# Patient Record
Sex: Female | Born: 1940 | Race: White | Hispanic: No | Marital: Married | Smoking: Never smoker
Health system: Southern US, Community
[De-identification: ages and names within clinical notes are randomized; demographics above are authoritative.]

## PROBLEM LIST (undated history)

## (undated) DIAGNOSIS — R0600 Dyspnea, unspecified: Secondary | ICD-10-CM

## (undated) DIAGNOSIS — K219 Gastro-esophageal reflux disease without esophagitis: Secondary | ICD-10-CM

## (undated) DIAGNOSIS — M1990 Unspecified osteoarthritis, unspecified site: Secondary | ICD-10-CM

## (undated) DIAGNOSIS — Z923 Personal history of irradiation: Secondary | ICD-10-CM

## (undated) DIAGNOSIS — N189 Chronic kidney disease, unspecified: Secondary | ICD-10-CM

## (undated) DIAGNOSIS — C801 Malignant (primary) neoplasm, unspecified: Secondary | ICD-10-CM

## (undated) DIAGNOSIS — C50919 Malignant neoplasm of unspecified site of unspecified female breast: Secondary | ICD-10-CM

## (undated) DIAGNOSIS — D649 Anemia, unspecified: Secondary | ICD-10-CM

## (undated) DIAGNOSIS — J45909 Unspecified asthma, uncomplicated: Secondary | ICD-10-CM

## (undated) DIAGNOSIS — D869 Sarcoidosis, unspecified: Secondary | ICD-10-CM

## (undated) DIAGNOSIS — I10 Essential (primary) hypertension: Secondary | ICD-10-CM

## (undated) DIAGNOSIS — E039 Hypothyroidism, unspecified: Secondary | ICD-10-CM

## (undated) HISTORY — PX: ABDOMINAL HYSTERECTOMY: SHX81

## (undated) HISTORY — PX: HAMMER TOE SURGERY: SHX385

## (undated) HISTORY — PX: TUMOR REMOVAL: SHX12

## (undated) HISTORY — PX: BREAST LUMPECTOMY: SHX2

## (undated) HISTORY — PX: BREAST SURGERY: SHX581

## (undated) HISTORY — PX: EYE SURGERY: SHX253

## (undated) HISTORY — PX: JOINT REPLACEMENT: SHX530

---
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
## 2007-09-06 DIAGNOSIS — Z923 Personal history of irradiation: Secondary | ICD-10-CM

## 2007-09-06 DIAGNOSIS — C50919 Malignant neoplasm of unspecified site of unspecified female breast: Secondary | ICD-10-CM

## 2007-09-06 HISTORY — DX: Personal history of irradiation: Z92.3

## 2007-09-06 HISTORY — DX: Malignant neoplasm of unspecified site of unspecified female breast: C50.919

## 2010-11-04 DEATH — deceased

## 2016-09-05 DIAGNOSIS — H0510 Unspecified chronic inflammatory disorders of orbit: Secondary | ICD-10-CM

## 2016-09-05 HISTORY — DX: Unspecified chronic inflammatory disorders of orbit: H05.10

## 2019-08-21 ENCOUNTER — Other Ambulatory Visit: Payer: Self-pay | Admitting: Orthopedic Surgery

## 2019-08-22 ENCOUNTER — Encounter
Admission: RE | Admit: 2019-08-22 | Discharge: 2019-08-22 | Disposition: A | Discharge status: Home / Self Care | Payer: Medicare Other | Source: Ambulatory Visit | Attending: Orthopedic Surgery | Admitting: Orthopedic Surgery

## 2019-08-22 ENCOUNTER — Other Ambulatory Visit: Payer: Self-pay

## 2019-08-22 DIAGNOSIS — Z01818 Encounter for other preprocedural examination: Secondary | ICD-10-CM | POA: Insufficient documentation

## 2019-08-22 DIAGNOSIS — Z20828 Contact with and (suspected) exposure to other viral communicable diseases: Secondary | ICD-10-CM | POA: Diagnosis not present

## 2019-08-22 DIAGNOSIS — I10 Essential (primary) hypertension: Secondary | ICD-10-CM | POA: Diagnosis not present

## 2019-08-22 HISTORY — DX: Gastro-esophageal reflux disease without esophagitis: K21.9

## 2019-08-22 HISTORY — DX: Dyspnea, unspecified: R06.00

## 2019-08-22 HISTORY — DX: Essential (primary) hypertension: I10

## 2019-08-22 HISTORY — DX: Unspecified asthma, uncomplicated: J45.909

## 2019-08-22 HISTORY — DX: Sarcoidosis, unspecified: D86.9

## 2019-08-22 HISTORY — DX: Hypothyroidism, unspecified: E03.9

## 2019-08-22 HISTORY — DX: Malignant (primary) neoplasm, unspecified: C80.1

## 2019-08-22 HISTORY — DX: Chronic kidney disease, unspecified: N18.9

## 2019-08-22 HISTORY — DX: Unspecified osteoarthritis, unspecified site: M19.90

## 2019-08-22 HISTORY — DX: Anemia, unspecified: D64.9

## 2019-08-22 NOTE — Patient Instructions (Addendum)
Your procedure is scheduled on: 08-27-19 TUESDAY Report to Same Day Surgery 2nd floor medical mall [facility] Entrance-take elevator on left to 2nd floor.  Check in with surgery information desk.) To find out your arrival time please call [phone] between 1PM - 3PM on 08-26-19 MONDAY  Remember: Instructions that are not followed completely may result in serious medical risk, up to and including death, or upon the discretion of your surgeon and anesthesiologist your surgery may need to be rescheduled.    _x___ 1. Do not eat food after midnight the night before your procedure. NO GUM OR CANDY AFTER MIDNIGHT. You may drink clear liquids up to 2 hours before you are scheduled to arrive at the hospital for your procedure.  Do not drink clear liquids within 2 hours of your scheduled arrival to the hospital.  Clear liquids include  --Water or Apple juice without pulp  --Gatorade  --Black Coffee or Clear Tea (No milk, no creamers, do not add anything to the coffee or Tea   ____Ensure clear carbohydrate drink on the way to the hospital for bariatric patients  _X___Ensure clear carbohydrate drink 3 hours  PRIOR TO ARRIVAL TIME (IF YOUR ARRIVAL TIME IS 6 AM, HAVE ENSURE FINISHED BY 4:30 AM.)     __x__ 2. No Alcohol for 24 hours before or after surgery.   __x__3. No Smoking or e-cigarettes for 24 prior to surgery.  Do not use any chewable tobacco products for at least 6 hour prior to surgery   ____  4. Bring all medications with you on the day of surgery if instructed.    __x__ 5. Notify your doctor if there is any change in your medical condition     (cold, fever, infections).    x___6. On the morning of surgery brush your teeth with toothpaste and water.  You may rinse your mouth with mouth wash if you wish.  Do not swallow any toothpaste or mouthwash.   Do not wear jewelry, make-up, hairpins, clips or nail polish.  Do not wear lotions, powders, or perfumes.   Do not shave 48 hours  prior to surgery. Men may shave face and neck.  Do not bring valuables to the hospital.    [facility] is not responsible for any belongings or valuables.               Contacts, dentures or bridgework may not be worn into surgery.  Leave your suitcase in the car. After surgery it may be brought to your room.  For patients admitted to the hospital, discharge time is determined by your  treatment team.  _  Patients discharged the day of surgery will not be allowed to drive home.  You will need someone to drive you home and stay with you the night of your procedure.    Please read over the following fact sheets that you were given:   [facility] Preparing for Surgery and or MRSA Information   _x___ TAKE THE FOLLOWING MEDICATION THE MORNING OF SURGERY WITH A SMALL SIP OF WATER. These include:  1. METOPROLOL  2. SYNTHROID (LEVOTHYROXINE)  3. PRILOSEC (OMEPRAZOLE)  4. TAKE AN EXTRA PRILOSEC THE NIGHT BEFORE YOUR SURGERY  5.  6.  ____Fleets enema or Magnesium Citrate as directed.   _x___ Use CHG Soap or sage wipes as directed on instruction sheet   _X___ Use inhalers on the day of surgery and bring to hospital day of surgery-USE YOUR ALBUTEROL AND ADVAIR INHALER DAY OF SURGERY  AND BRING ALBUTEROL INHALER TO HOSPITAL  ____ Stop Metformin and Janumet 2 days prior to surgery.    ____ Take 1/2 of usual insulin dose the night before surgery and none on the morning surgery.   ____ Follow recommendations from Cardiologist, Pulmonologist or PCP regarding stopping Aspirin, Coumadin, Plavix ,Eliquis, Effient, or Pradaxa, and Pletal.  X____Stop Anti-inflammatories such as Advil, Aleve, Ibuprofen, Motrin, Naproxen, Naprosyn, Goodies powders or aspirin products NOW-OK to take Tylenol    ____ Stop supplements until after surgery   ____ Bring C-Pap to the hospital.

## 2019-08-23 ENCOUNTER — Other Ambulatory Visit: Payer: Self-pay

## 2019-08-23 ENCOUNTER — Other Ambulatory Visit: Admission: RE | Admit: 2019-08-23 | Payer: Medicare Other | Source: Ambulatory Visit

## 2019-08-23 ENCOUNTER — Encounter: Payer: Self-pay | Admitting: Orthopedic Surgery

## 2019-08-23 ENCOUNTER — Encounter
Admission: RE | Admit: 2019-08-23 | Discharge: 2019-08-23 | Disposition: A | Discharge status: Home / Self Care | Payer: Medicare Other | Source: Ambulatory Visit | Attending: Orthopedic Surgery | Admitting: Orthopedic Surgery

## 2019-08-23 DIAGNOSIS — Z01818 Encounter for other preprocedural examination: Secondary | ICD-10-CM | POA: Diagnosis not present

## 2019-08-23 LAB — BASIC METABOLIC PANEL
Anion gap: 11 (ref 5–15)
BUN: 38 mg/dL — ABNORMAL HIGH (ref 8–23)
CO2: 21 mmol/L — ABNORMAL LOW (ref 22–32)
Calcium: 9.5 mg/dL (ref 8.9–10.3)
Chloride: 107 mmol/L (ref 98–111)
Creatinine, Ser: 1.86 mg/dL — ABNORMAL HIGH (ref 0.44–1.00)
GFR calc Af Amer: 30 mL/min — ABNORMAL LOW (ref >60)
GFR calc non Af Amer: 26 mL/min — ABNORMAL LOW (ref >60)
Glucose, Bld: 104 mg/dL — ABNORMAL HIGH (ref 70–99)
Potassium: 4.5 mmol/L (ref 3.5–5.1)
Sodium: 139 mmol/L (ref 135–145)

## 2019-08-23 LAB — SURGICAL PCR SCREEN
MRSA, PCR: NEGATIVE
Staphylococcus aureus: POSITIVE — AB

## 2019-08-23 LAB — TYPE AND SCREEN
ABO/RH(D): O POS
Antibody Screen: NEGATIVE

## 2019-08-23 LAB — SARS CORONAVIRUS 2 (TAT 6-24 HRS): SARS Coronavirus 2: NEGATIVE

## 2019-08-23 LAB — CBC
HCT: 39.8 % (ref 36.0–46.0)
Hemoglobin: 12.8 g/dL (ref 12.0–15.0)
MCH: 28.6 pg (ref 26.0–34.0)
MCHC: 32.2 g/dL (ref 30.0–36.0)
MCV: 88.8 fL (ref 80.0–100.0)
Platelets: 176 10*3/uL (ref 150–400)
RBC: 4.48 MIL/uL (ref 3.87–5.11)
RDW: 13.4 % (ref 11.5–15.5)
WBC: 5.7 10*3/uL (ref 4.0–10.5)
nRBC: 0 % (ref 0.0–0.2)

## 2019-08-23 NOTE — Pre-Procedure Instructions (Signed)
Progress Notes - documented in this encounter Babaoff, Jacqulyn Liner, MD - 07/30/2019 3:30 PM EST Formatting of this note might be different from the original. Kelly Pena is a 78 y.o. female who is here for an acute visit and to establish care.  HTN: She is taking amlodipine, benazepril, and metoprolol and she tolerates this well. Her blood pressure has been reasonably well controlled on this regimen.  She has a known history of sarcoidosis and states that she was on prednisone for a long time.  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   Asthma: She is taking advair and singulair daily. She doesn't need to use her albuterol very often.  Hypothyroid: She is taking levothyroxine and she is feeling fine on this regimen.  Ulcerative colitis: She has been in remission and has not been on medicine. She states that her colonoscopy was a year ago.  GERD: She is taking omeprazole and this works well for her.  There is no problem list on file for this patient.  Past Medical History:  Diagnosis Date  . Allergy  . Arthritis  . Asthma, unspecified asthma severity, unspecified whether complicated, unspecified whether persistent  . Eczema, unspecified  . GERD (gastroesophageal reflux disease)  . History of cancer  breast- right  . History of cataract  . Hypertension  . Idiopathic orbital inflammatory syndrome  left  . Sarcoidosis  . Ulcerative colitis (CMS-HCC)   Current Outpatient Medications:  . albuterol (VENTOLIN HFA) 90 mcg/actuation inhaler, Inhale 2 inhalations into the lungs every 6 (six) hours as needed for Wheezing, Disp: , Rfl:  . amLODIPine-benazepril (LOTREL) 5-40 mg capsule, Take 1 capsule by mouth once daily, Disp: , Rfl:  . fluticasone propion-salmeteroL (ADVAIR DISKUS) 250-50 mcg/dose diskus inhaler, Inhale 1 inhalation into the lungs every 12 (twelve) hours, Disp: , Rfl:  . levothyroxine (SYNTHROID) 25 MCG  tablet, Take 25 mcg by mouth once daily Take on an empty stomach with a glass of water at least 30-60 minutes before breakfast., Disp: , Rfl:  . metoprolol succinate (TOPROL-XL) 100 MG XL tablet, Take 100 mg by mouth once daily, Disp: , Rfl:  . montelukast (SINGULAIR) 10 mg tablet, Take 10 mg by mouth nightly, Disp: , Rfl:  . omeprazole (PRILOSEC) 20 MG DR capsule, Take 20 mg by mouth once daily, Disp: , Rfl:   Allergies  Allergen Reactions  . Hydrochlorothiazide Rash  . Sulfa (Sulfonamide Antibiotics) Other (See Comments)  difficulty breathing   Results for orders placed or performed in visit on 04/30/19  LAB RESULT EXTERNAL  Narrative  Ordered by an unspecified provider.   Review of Systems  No fever, chills, nausea, or vomiting  Exam  Vitals:  07/30/19 1530  BP: 124/82  Pulse: 72  SpO2: 95%  Weight: 95.3 kg (210 lb)  Height: 158.8 cm (5' 2.5")   Body mass index is 37.8 kg/m.  General: Patient is well-groomed, well-nourished, appears stated age in no acute distress  HEENT: head is atraumatic and normocephalic, neck is supple with no palpable nodules  CV: Regular rhythm and normal heart rate, no murmur, no JVD  Respiratory: Clear to auscultation throughout lung fields with no wheezing, crackles, or rhonchi. No increased work of breathing  Impression/Plan  Right knee pain s/p TKA: I discussed that she will need to see an Orthopedist to discuss options for what can be done for this knee since she is 17 years s/p TKA.  HTN: Her blood pressure is at goal.  Sarcoidosis: She is no longer taking prednisone for this.  Asthma: She is doing well on her current regimen.  Hypothyroid: She is feeling fine on her current regimen.  GERD: She is doing well on omeprazole.  UC: She is in remission and denies any blood in her stool.    Electronically signed by Barnabas Lister, MD at 07/30/2019 4:00 PM EST

## 2019-08-27 ENCOUNTER — Ambulatory Visit: Payer: Medicare Other | Admitting: Certified Registered Nurse Anesthetist

## 2019-08-27 ENCOUNTER — Other Ambulatory Visit: Payer: Self-pay

## 2019-08-27 ENCOUNTER — Inpatient Hospital Stay
Admission: AD | Admit: 2019-08-27 | Discharge: 2019-08-27 | DRG: 489 | Disposition: A | Discharge status: Home / Self Care | Payer: Medicare Other | Attending: Orthopedic Surgery | Admitting: Orthopedic Surgery

## 2019-08-27 ENCOUNTER — Ambulatory Visit: Payer: Medicare Other

## 2019-08-27 ENCOUNTER — Encounter
Admission: AD | Disposition: A | Discharge status: Home / Self Care | Payer: Self-pay | Source: Home / Self Care | Attending: Orthopedic Surgery

## 2019-08-27 ENCOUNTER — Encounter: Payer: Self-pay | Admitting: Orthopedic Surgery

## 2019-08-27 DIAGNOSIS — Y792 Prosthetic and other implants, materials and accessory orthopedic devices associated with adverse incidents: Secondary | ICD-10-CM | POA: Diagnosis present

## 2019-08-27 DIAGNOSIS — Z96659 Presence of unspecified artificial knee joint: Secondary | ICD-10-CM

## 2019-08-27 DIAGNOSIS — Z888 Allergy status to other drugs, medicaments and biological substances status: Secondary | ICD-10-CM | POA: Diagnosis not present

## 2019-08-27 DIAGNOSIS — D869 Sarcoidosis, unspecified: Secondary | ICD-10-CM | POA: Diagnosis present

## 2019-08-27 DIAGNOSIS — I10 Essential (primary) hypertension: Secondary | ICD-10-CM | POA: Diagnosis present

## 2019-08-27 DIAGNOSIS — Z9011 Acquired absence of right breast and nipple: Secondary | ICD-10-CM | POA: Diagnosis not present

## 2019-08-27 DIAGNOSIS — Z853 Personal history of malignant neoplasm of breast: Secondary | ICD-10-CM | POA: Diagnosis not present

## 2019-08-27 DIAGNOSIS — Z79899 Other long term (current) drug therapy: Secondary | ICD-10-CM

## 2019-08-27 DIAGNOSIS — E039 Hypothyroidism, unspecified: Secondary | ICD-10-CM | POA: Diagnosis present

## 2019-08-27 DIAGNOSIS — Z90710 Acquired absence of both cervix and uterus: Secondary | ICD-10-CM | POA: Diagnosis not present

## 2019-08-27 DIAGNOSIS — J45909 Unspecified asthma, uncomplicated: Secondary | ICD-10-CM | POA: Diagnosis present

## 2019-08-27 DIAGNOSIS — T84062A Wear of articular bearing surface of internal prosthetic right knee joint, initial encounter: Secondary | ICD-10-CM | POA: Diagnosis present

## 2019-08-27 DIAGNOSIS — K219 Gastro-esophageal reflux disease without esophagitis: Secondary | ICD-10-CM | POA: Diagnosis present

## 2019-08-27 DIAGNOSIS — Z96651 Presence of right artificial knee joint: Secondary | ICD-10-CM

## 2019-08-27 DIAGNOSIS — Z79890 Hormone replacement therapy (postmenopausal): Secondary | ICD-10-CM | POA: Diagnosis not present

## 2019-08-27 DIAGNOSIS — T84018A Broken internal joint prosthesis, other site, initial encounter: Secondary | ICD-10-CM

## 2019-08-27 DIAGNOSIS — Z882 Allergy status to sulfonamides status: Secondary | ICD-10-CM

## 2019-08-27 DIAGNOSIS — G8918 Other acute postprocedural pain: Secondary | ICD-10-CM

## 2019-08-27 HISTORY — DX: Presence of unspecified artificial knee joint: Z96.659

## 2019-08-27 HISTORY — DX: Presence of unspecified artificial knee joint: T84.018A

## 2019-08-27 HISTORY — PX: TOTAL KNEE REVISION: SHX996

## 2019-08-27 LAB — ABO/RH: ABO/RH(D): O POS

## 2019-08-27 SURGERY — TOTAL KNEE REVISION
Anesthesia: Spinal | Site: Knee | Laterality: Right

## 2019-08-27 MED ORDER — ENOXAPARIN SODIUM 40 MG/0.4ML ~~LOC~~ SOLN: 40.0000 mg | SUBCUTANEOUS | 0 refills | Status: DC

## 2019-08-27 MED ORDER — CEFAZOLIN SODIUM-DEXTROSE 2-4 GM/100ML-% IV SOLN
INTRAVENOUS | Status: AC
  Filled 2019-08-27: qty 100

## 2019-08-27 MED ORDER — LACTATED RINGERS IV SOLN: Freq: Once | INTRAVENOUS | Status: AC

## 2019-08-27 MED ORDER — ONDANSETRON HCL 4 MG/2ML IJ SOLN
INTRAMUSCULAR | Status: AC
  Filled 2019-08-27: qty 2

## 2019-08-27 MED ORDER — METOCLOPRAMIDE HCL 10 MG PO TABS: 5.0000 mg | ORAL_TABLET | Freq: Three times a day (TID) | ORAL | Status: DC | PRN

## 2019-08-27 MED ORDER — BUPIVACAINE HCL (PF) 0.5 % IJ SOLN
INTRAMUSCULAR | Status: DC | PRN
  Administered 2019-08-27: 2 mL

## 2019-08-27 MED ORDER — MENTHOL 3 MG MT LOZG
1.0000 | LOZENGE | OROMUCOSAL | Status: DC | PRN
  Filled 2019-08-27: qty 9

## 2019-08-27 MED ORDER — PROPOFOL 10 MG/ML IV BOLUS
INTRAVENOUS | Status: DC | PRN
  Administered 2019-08-27 (×2): 40 mg via INTRAVENOUS

## 2019-08-27 MED ORDER — SODIUM CHLORIDE FLUSH 0.9 % IV SOLN
INTRAVENOUS | Status: AC
  Filled 2019-08-27: qty 40

## 2019-08-27 MED ORDER — EPINEPHRINE PF 1 MG/ML IJ SOLN
INTRAMUSCULAR | Status: AC
  Filled 2019-08-27: qty 1

## 2019-08-27 MED ORDER — DOCUSATE SODIUM 100 MG PO CAPS: 100.0000 mg | ORAL_CAPSULE | Freq: Two times a day (BID) | ORAL | Status: DC

## 2019-08-27 MED ORDER — SODIUM CHLORIDE 0.9 % IV SOLN: INTRAVENOUS | Status: DC | PRN

## 2019-08-27 MED ORDER — PROPOFOL 500 MG/50ML IV EMUL
INTRAVENOUS | Status: AC
  Filled 2019-08-27: qty 50

## 2019-08-27 MED ORDER — ACETAMINOPHEN 10 MG/ML IV SOLN: INTRAVENOUS | Status: DC | PRN

## 2019-08-27 MED ORDER — FENTANYL CITRATE (PF) 100 MCG/2ML IJ SOLN: 25.0000 ug | INTRAMUSCULAR | Status: DC | PRN

## 2019-08-27 MED ORDER — SODIUM CHLORIDE 0.9 % IV SOLN: INTRAVENOUS | Status: DC

## 2019-08-27 MED ORDER — DOCUSATE SODIUM 100 MG PO CAPS: 100.0000 mg | ORAL_CAPSULE | Freq: Two times a day (BID) | ORAL | 0 refills | Status: AC

## 2019-08-27 MED ORDER — ONDANSETRON HCL 4 MG/2ML IJ SOLN: 4.0000 mg | Freq: Four times a day (QID) | INTRAMUSCULAR | Status: DC | PRN

## 2019-08-27 MED ORDER — BUPIVACAINE HCL (PF) 0.25 % IJ SOLN
INTRAMUSCULAR | Status: AC
  Filled 2019-08-27: qty 30

## 2019-08-27 MED ORDER — ONDANSETRON HCL 4 MG/2ML IJ SOLN: 4.0000 mg | Freq: Once | INTRAMUSCULAR | Status: DC | PRN

## 2019-08-27 MED ORDER — MIDAZOLAM HCL 2 MG/2ML IJ SOLN
INTRAMUSCULAR | Status: AC
  Filled 2019-08-27: qty 2

## 2019-08-27 MED ORDER — DEXAMETHASONE SODIUM PHOSPHATE 10 MG/ML IJ SOLN
INTRAMUSCULAR | Status: AC
  Filled 2019-08-27: qty 1

## 2019-08-27 MED ORDER — FENTANYL CITRATE (PF) 100 MCG/2ML IJ SOLN
INTRAMUSCULAR | Status: AC
  Filled 2019-08-27: qty 2

## 2019-08-27 MED ORDER — SODIUM CHLORIDE 0.9 % IV SOLN
INTRAVENOUS | Status: DC | PRN
  Administered 2019-08-27: 20 ug/min via INTRAVENOUS

## 2019-08-27 MED ORDER — ONDANSETRON HCL 4 MG/2ML IJ SOLN
INTRAMUSCULAR | Status: DC | PRN
  Administered 2019-08-27: 4 mg via INTRAVENOUS

## 2019-08-27 MED ORDER — CEFAZOLIN SODIUM-DEXTROSE 2-4 GM/100ML-% IV SOLN
2.0000 g | INTRAVENOUS | Status: AC
  Administered 2019-08-27: 2 g via INTRAVENOUS

## 2019-08-27 MED ORDER — OXYCODONE HCL 5 MG PO TABS
5.0000 mg | ORAL_TABLET | ORAL | Status: DC | PRN
  Administered 2019-08-27: 5 mg via ORAL

## 2019-08-27 MED ORDER — SODIUM CHLORIDE 0.9 % IV SOLN
INTRAVENOUS | Status: DC | PRN
  Administered 2019-08-27: 60 mL

## 2019-08-27 MED ORDER — MORPHINE SULFATE (PF) 10 MG/ML IV SOLN
INTRAVENOUS | Status: AC
  Filled 2019-08-27: qty 1

## 2019-08-27 MED ORDER — OXYCODONE HCL 5 MG PO TABS
ORAL_TABLET | ORAL | Status: AC
  Filled 2019-08-27: qty 1

## 2019-08-27 MED ORDER — ALUM & MAG HYDROXIDE-SIMETH 200-200-20 MG/5ML PO SUSP: 30.0000 mL | ORAL | Status: DC | PRN

## 2019-08-27 MED ORDER — BUPIVACAINE LIPOSOME 1.3 % IJ SUSP
INTRAMUSCULAR | Status: AC
  Filled 2019-08-27: qty 20

## 2019-08-27 MED ORDER — PHENOL 1.4 % MT LIQD
1.0000 | OROMUCOSAL | Status: DC | PRN
  Filled 2019-08-27: qty 177

## 2019-08-27 MED ORDER — ONDANSETRON HCL 4 MG PO TABS: 4.0000 mg | ORAL_TABLET | Freq: Four times a day (QID) | ORAL | Status: DC | PRN

## 2019-08-27 MED ORDER — ACETAMINOPHEN 325 MG PO TABS: 325.0000 mg | ORAL_TABLET | Freq: Four times a day (QID) | ORAL | Status: DC | PRN

## 2019-08-27 MED ORDER — CEFAZOLIN SODIUM-DEXTROSE 2-4 GM/100ML-% IV SOLN
INTRAVENOUS | Status: AC
  Administered 2019-08-27: 2 g via INTRAVENOUS
  Filled 2019-08-27: qty 100

## 2019-08-27 MED ORDER — NEOMYCIN-POLYMYXIN B GU 40-200000 IR SOLN
Status: AC
  Filled 2019-08-27: qty 20

## 2019-08-27 MED ORDER — MAGNESIUM HYDROXIDE 400 MG/5ML PO SUSP
30.0000 mL | Freq: Every day | ORAL | Status: DC | PRN
  Filled 2019-08-27: qty 30

## 2019-08-27 MED ORDER — OXYCODONE HCL 5 MG PO TABS: 5.0000 mg | ORAL_TABLET | ORAL | 0 refills | Status: AC | PRN

## 2019-08-27 MED ORDER — OXYCODONE HCL 5 MG PO TABS: 10.0000 mg | ORAL_TABLET | ORAL | Status: DC | PRN

## 2019-08-27 MED ORDER — MIDAZOLAM HCL 5 MG/5ML IJ SOLN
INTRAMUSCULAR | Status: DC | PRN
  Administered 2019-08-27 (×2): 1 mg via INTRAVENOUS

## 2019-08-27 MED ORDER — CHLORHEXIDINE GLUCONATE 4 % EX LIQD: 60.0000 mL | Freq: Once | CUTANEOUS | Status: DC

## 2019-08-27 MED ORDER — HYDROMORPHONE HCL 1 MG/ML IJ SOLN: 0.5000 mg | INTRAMUSCULAR | Status: DC | PRN

## 2019-08-27 MED ORDER — LIDOCAINE HCL (PF) 2 % IJ SOLN
INTRAMUSCULAR | Status: AC
  Filled 2019-08-27: qty 10

## 2019-08-27 MED ORDER — NEOMYCIN-POLYMYXIN B GU 40-200000 IR SOLN
Status: DC | PRN
  Administered 2019-08-27: 16 mL

## 2019-08-27 MED ORDER — ACETAMINOPHEN 10 MG/ML IV SOLN
INTRAVENOUS | Status: AC
  Filled 2019-08-27: qty 100

## 2019-08-27 MED ORDER — ACETAMINOPHEN 500 MG PO TABS: 1000.0000 mg | ORAL_TABLET | Freq: Four times a day (QID) | ORAL | Status: DC

## 2019-08-27 MED ORDER — BUPIVACAINE-EPINEPHRINE 0.25% -1:200000 IJ SOLN
INTRAMUSCULAR | Status: DC | PRN
  Administered 2019-08-27: 30 mL

## 2019-08-27 MED ORDER — PROPOFOL 500 MG/50ML IV EMUL
INTRAVENOUS | Status: DC | PRN
  Administered 2019-08-27: 100 ug/kg/min via INTRAVENOUS

## 2019-08-27 MED ORDER — METOCLOPRAMIDE HCL 5 MG/ML IJ SOLN: 5.0000 mg | Freq: Three times a day (TID) | INTRAMUSCULAR | Status: DC | PRN

## 2019-08-27 MED ORDER — PROPOFOL 10 MG/ML IV BOLUS
INTRAVENOUS | Status: AC
  Filled 2019-08-27: qty 20

## 2019-08-27 MED ORDER — FENTANYL CITRATE (PF) 100 MCG/2ML IJ SOLN
INTRAMUSCULAR | Status: AC
  Administered 2019-08-27: 25 ug via INTRAVENOUS
  Filled 2019-08-27: qty 2

## 2019-08-27 MED ORDER — LACTATED RINGERS IV SOLN: INTRAVENOUS | Status: DC

## 2019-08-27 MED ORDER — CEFAZOLIN SODIUM-DEXTROSE 2-4 GM/100ML-% IV SOLN: 2.0000 g | Freq: Four times a day (QID) | INTRAVENOUS | Status: DC

## 2019-08-27 MED ORDER — MORPHINE SULFATE (PF) 10 MG/ML IV SOLN
INTRAVENOUS | Status: DC | PRN
  Administered 2019-08-27: 10 mg

## 2019-08-27 MED ORDER — ASPIRIN EC 325 MG PO TBEC: 325.0000 mg | DELAYED_RELEASE_TABLET | Freq: Every day | ORAL | Status: DC

## 2019-08-27 MED ORDER — ACETAMINOPHEN 10 MG/ML IV SOLN
INTRAVENOUS | Status: DC | PRN
  Administered 2019-08-27: 1000 mg via INTRAVENOUS

## 2019-08-27 SURGICAL SUPPLY — 65 items
BLADE SAW 1 (BLADE) ×3 IMPLANT
BLADE SAW 1/2 (BLADE) ×3 IMPLANT
BNDG ELASTIC 6X5.8 VLCR STR LF (GAUZE/BANDAGES/DRESSINGS) ×3 IMPLANT
CANISTER PREVENA 45 (CANNISTER) ×2 IMPLANT
CANISTER SUCT 1200ML W/VALVE (MISCELLANEOUS) ×3 IMPLANT
CANISTER SUCT 3000ML PPV (MISCELLANEOUS) ×6 IMPLANT
CANISTER WOUND CARE 500ML ATS (WOUND CARE) ×1 IMPLANT
CHLORAPREP W/TINT 26 (MISCELLANEOUS) ×8 IMPLANT
COOLER ICEMAN CLASSIC (MISCELLANEOUS) ×3 IMPLANT
COVER BACK TABLE REUSABLE LG (DRAPES) ×3 IMPLANT
COVER WAND RF STERILE (DRAPES) ×3 IMPLANT
CUFF TOURN SGL QUICK 24 (TOURNIQUET CUFF)
CUFF TOURN SGL QUICK 30 (TOURNIQUET CUFF) ×2
CUFF TRNQT CYL 24X4X16.5-23 (TOURNIQUET CUFF) IMPLANT
CUFF TRNQT CYL 30X4X21-28X (TOURNIQUET CUFF) IMPLANT
DRAPE 3/4 80X56 (DRAPES) ×12 IMPLANT
ELECT CAUTERY BLADE 6.4 (BLADE) ×3 IMPLANT
ELECT REM PT RETURN 9FT ADLT (ELECTROSURGICAL) ×3
ELECTRODE REM PT RTRN 9FT ADLT (ELECTROSURGICAL) ×1 IMPLANT
GAUZE SPONGE 4X4 12PLY STRL (GAUZE/BANDAGES/DRESSINGS) ×3 IMPLANT
GAUZE XEROFORM 1X8 LF (GAUZE/BANDAGES/DRESSINGS) ×3 IMPLANT
GLOVE BIOGEL PI IND STRL 9 (GLOVE) ×1 IMPLANT
GLOVE BIOGEL PI INDICATOR 9 (GLOVE) ×2
GLOVE INDICATOR 8.0 STRL GRN (GLOVE) ×3 IMPLANT
GLOVE SURG ORTHO 8.0 STRL STRW (GLOVE) ×3 IMPLANT
GLOVE SURG SYN 9.0  PF PI (GLOVE) ×2
GLOVE SURG SYN 9.0 PF PI (GLOVE) ×1 IMPLANT
GOWN SRG 2XL LVL 4 RGLN SLV (GOWNS) ×1 IMPLANT
GOWN STRL NON-REIN 2XL LVL4 (GOWNS) ×2
GOWN STRL REUS W/ TWL LRG LVL3 (GOWN DISPOSABLE) ×1 IMPLANT
GOWN STRL REUS W/ TWL XL LVL3 (GOWN DISPOSABLE) ×1 IMPLANT
GOWN STRL REUS W/TWL LRG LVL3 (GOWN DISPOSABLE) ×4
GOWN STRL REUS W/TWL XL LVL3 (GOWN DISPOSABLE) ×2
HOLDER FOLEY CATH W/STRAP (MISCELLANEOUS) ×3 IMPLANT
HOOD PEEL AWAY FLYTE STAYCOOL (MISCELLANEOUS) ×6 IMPLANT
KIT PREVENA INCISION MGT20CM45 (CANNISTER) ×3 IMPLANT
KIT TURNOVER KIT A (KITS) ×3 IMPLANT
KNIFE SCULPS 14X20 (INSTRUMENTS) ×3 IMPLANT
NDL SPNL 18GX3.5 QUINCKE PK (NEEDLE) ×1 IMPLANT
NDL SPNL 20GX3.5 QUINCKE YW (NEEDLE) ×1 IMPLANT
NEEDLE SPNL 18GX3.5 QUINCKE PK (NEEDLE) ×3 IMPLANT
NEEDLE SPNL 20GX3.5 QUINCKE YW (NEEDLE) ×3 IMPLANT
NS IRRIG 1000ML POUR BTL (IV SOLUTION) ×3 IMPLANT
NexGen Complete Knee Solution Cruciate Retaining A ×2 IMPLANT
PACK TOTAL KNEE (MISCELLANEOUS) ×3 IMPLANT
PAD WRAPON POLAR KNEE (MISCELLANEOUS) ×1 IMPLANT
PULSAVAC PLUS IRRIG FAN TIP (DISPOSABLE) ×3
SCALPEL PROTECTED #10 DISP (BLADE) ×6 IMPLANT
SOL .9 NS 3000ML IRR  AL (IV SOLUTION) ×2
SOL .9 NS 3000ML IRR UROMATIC (IV SOLUTION) ×1 IMPLANT
STAPLER SKIN PROX 35W (STAPLE) ×3 IMPLANT
SUCTION FRAZIER HANDLE 10FR (MISCELLANEOUS) ×2
SUCTION TUBE FRAZIER 10FR DISP (MISCELLANEOUS) ×1 IMPLANT
SUT DVC 2 QUILL PDO  T11 36X36 (SUTURE) ×2
SUT DVC 2 QUILL PDO T11 36X36 (SUTURE) ×1 IMPLANT
SUT TICRON 2-0 30IN 311381 (SUTURE) IMPLANT
SUT V-LOC 90 ABS DVC 3-0 CL (SUTURE) ×3 IMPLANT
SWAB CULTURE AMIES ANAERIB BLU (MISCELLANEOUS) IMPLANT
SYR 20ML LL LF (SYRINGE) ×3 IMPLANT
SYR 50ML LL SCALE MARK (SYRINGE) ×6 IMPLANT
TIP FAN IRRIG PULSAVAC PLUS (DISPOSABLE) ×1 IMPLANT
TOWEL OR 17X26 4PK STRL BLUE (TOWEL DISPOSABLE) ×3 IMPLANT
TOWER CARTRIDGE SMART MIX (DISPOSABLE) ×3 IMPLANT
TRAY FOLEY MTR SLVR 16FR STAT (SET/KITS/TRAYS/PACK) ×3 IMPLANT
WRAPON POLAR PAD KNEE (MISCELLANEOUS) ×3

## 2019-08-27 NOTE — Anesthesia Preprocedure Evaluation (Signed)
Anesthesia Evaluation  Patient identified by MRN, date of birth, ID band Patient awake    Reviewed: Allergy & Precautions, H&P , NPO status , Patient's Chart, lab work & pertinent test results, reviewed documented beta blocker date and time   History of Anesthesia Complications Negative for: history of anesthetic complications  Airway Mallampati: I  TM Distance: >3 FB Neck ROM: full    Dental  (+) Dental Advidsory Given, Caps, Teeth Intact   Pulmonary neg shortness of breath, asthma , neg sleep apnea, neg COPD, neg recent URI,    Pulmonary exam normal        Cardiovascular Exercise Tolerance: Good hypertension, (-) angina(-) Past MI and (-) Cardiac Stents Normal cardiovascular exam(-) dysrhythmias (-) Valvular Problems/Murmurs     Neuro/Psych negative neurological ROS  negative psych ROS   GI/Hepatic Neg liver ROS, GERD  ,  Endo/Other  neg diabetesHypothyroidism   Renal/GU CRFRenal disease  negative genitourinary   Musculoskeletal   Abdominal   Peds  Hematology negative hematology ROS (+)   Anesthesia Other Findings Past Medical History: No date: Anemia No date: Arthritis No date: Asthma     Comment:  WELL CONTROLLED No date: Cancer ([place])     Comment:  RIGHT BREAST No date: Chronic kidney disease     Comment:  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 BUT               WAS FINALLY TAKEN OFF No date: Dyspnea     Comment:  DUE TO ASTHMA No date: GERD (gastroesophageal reflux disease) No date: Hypertension No date: 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 No date: Sarcoidosis   Reproductive/Obstetrics negative OB ROS                             Anesthesia Physical Anesthesia Plan  ASA: III  Anesthesia Plan: Spinal   Post-op Pain Management:    Induction:   PONV Risk Score and Plan: 2 and Propofol infusion and  TIVA  Airway Management Planned: Natural Airway and Simple Face Mask  Additional Equipment:   Intra-op Plan:   Post-operative Plan:   Informed Consent: I have reviewed the patients History and Physical, chart, labs and discussed the procedure including the risks, benefits and alternatives for the proposed anesthesia with the patient or authorized representative who has indicated his/her understanding and acceptance.     Dental Advisory Given  Plan Discussed with: Anesthesiologist, CRNA and Surgeon  Anesthesia Plan Comments:         Anesthesia Quick Evaluation

## 2019-08-27 NOTE — Anesthesia Procedure Notes (Signed)
Date/Time: 08/27/2019 7:30 AM Performed by: Nelda Marseille, CRNA Pre-anesthesia Checklist: Patient identified, Emergency Drugs available, Suction available and Patient being monitored Patient Re-evaluated:Patient Re-evaluated prior to induction Oxygen Delivery Method: Simple face mask Airway Equipment and Method: Oral airway

## 2019-08-27 NOTE — Progress Notes (Signed)
Meal ordered for patient. Coffee given to patient

## 2019-08-27 NOTE — Anesthesia Postprocedure Evaluation (Addendum)
Anesthesia Post Note  Patient: Kelly Pena  Procedure(s) Performed: REVISION OF TIBIAL POLYETHYLENE COMPONENT (Right Knee)  Patient location during evaluation: PACU Anesthesia Type: Spinal Level of consciousness: awake and alert and oriented Pain management: pain level controlled Vital Signs Assessment: post-procedure vital signs reviewed and stable Respiratory status: spontaneous breathing, respiratory function stable and patient connected to nasal cannula oxygen Cardiovascular status: blood pressure returned to baseline and stable Postop Assessment: no apparent nausea or vomiting Anesthetic complications: no     Last Vitals:  Vitals:   08/27/19 0907 08/27/19 1409  BP: 123/73   Pulse: 68 66  Resp: 17 14  Temp:  36.7 C  SpO2: 100% 96%    Last Pain:  Vitals:   08/27/19 1409  TempSrc:   PainSc: 2                  Kelly Pena

## 2019-08-27 NOTE — Addendum Note (Signed)
Addendum  created 08/27/19 1537 by Martha Clan, MD   Clinical Note Signed

## 2019-08-27 NOTE — Transfer of Care (Addendum)
Immediate Anesthesia Transfer of Care Note  Patient: Kelly Pena  Procedure(s) Performed: REVISION OF TIBIAL POLYETHYLENE COMPONENT (Right Knee)  Patient Location: PACU  Anesthesia Type:Spinal  Level of Consciousness: awake  Airway & Oxygen Therapy: Patient Spontanous Breathing and Patient connected to face mask oxygen  Post-op Assessment: Report given to RN and Post -op Vital signs reviewed and stable  Post vital signs: Reviewed and stable  Last Vitals:  Vitals Value Taken Time  BP 123/73 08/27/19 0905  Temp    Pulse 72 08/27/19 0905  Resp 18 08/27/19 0905  SpO2 100 % 08/27/19 0905  Vitals shown include unvalidated device data.  Last Pain:  Vitals:   08/27/19 0622  TempSrc: Tympanic  PainSc: 0-No pain         Complications: No apparent anesthesia complications

## 2019-08-27 NOTE — Anesthesia Post-op Follow-up Note (Signed)
Anesthesia QCDR form completed.        

## 2019-08-27 NOTE — Discharge Instructions (Addendum)
Work with home health therapy.  If you have not heard from them by noon tomorrow call our office.  Polar Care to knee will need ice changed out.  The blue foam is to keep your leg straight you do not use it when you are sitting up but use it when you are laying down.  Pain medicine as directed.  Colace should be taken twice a day to prevent constipation.  If you do not have a bowel movement by Thursday take milk of magnesia.  Call office if you are having problems. Patient will need to use the polar care as much as possible for the first 48 hours and then after physical therapy and as needed.   AMBULATORY SURGERY  DISCHARGE INSTRUCTIONS   1) The drugs that you were given will stay in your system until tomorrow so for the next 24 hours you should not:  A) Drive an automobile B) Make any legal decisions C) Drink any alcoholic beverage   2) You may resume regular meals tomorrow.  Today it is better to start with liquids and gradually work up to solid foods.  You may eat anything you prefer, but it is better to start with liquids, then soup and crackers, and gradually work up to solid foods.   3) Please notify your doctor immediately if you have any unusual bleeding, trouble breathing, redness and pain at the surgery site, drainage, fever, or pain not relieved by medication.    4) Additional Instructions:        Please contact your physician with any problems or Same Day Surgery at [phone], Monday through Friday 6 am to 4 pm, or [place] at [facility] number at [phone].

## 2019-08-27 NOTE — Progress Notes (Signed)
Patient was given instructions on the Lovenox injections. I showed the patient areas to give herself the injections along with a video to show how to give the injections. The video was produced by the company that is providing the patient with medication. Patient stated that she will try to comply with the injections and if she cannot then her husband can since he is a diabetic. I will show the husband information also. I have given the patient the kit that contains information and a biohazard container.

## 2019-08-27 NOTE — Anesthesia Procedure Notes (Signed)
Spinal  Patient location during procedure: OR Start time: 08/27/2019 7:23 AM End time: 08/27/2019 7:33 AM Staffing Performed: resident/CRNA  Anesthesiologist: Martha Clan, MD Resident/CRNA: Nelda Marseille, CRNA Other anesthesia staff: Norm Salt, RN Preanesthetic Checklist Completed: patient identified, IV checked, site marked, risks and benefits discussed, surgical consent, monitors and equipment checked and pre-op evaluation Spinal Block Patient position: sitting Prep: ChloraPrep Patient monitoring: heart rate, continuous pulse ox and blood pressure Approach: midline Location: L4-5 Injection technique: single-shot Needle Needle type: Whitacre and Introducer  Needle gauge: 24 G Needle length: 9 cm Additional Notes Negative paresthesia. Negative blood return. Positive free-flowing CSF. Expiration date of kit checked and confirmed. Patient tolerated procedure well, without complications.

## 2019-08-27 NOTE — H&P (Signed)
Reviewed paper H+P, will be scanned into chart. No changes noted.  

## 2019-08-27 NOTE — OR Nursing (Signed)
Patient prepared for surgery. Dr. Rudene Christians in to see patient and reports physical therapy has been set up by his office for patient when she goes back to [facility]. Request

## 2019-08-27 NOTE — Op Note (Signed)
08/27/2019  9:08 AM  PATIENT:  Kelly Pena  78 y.o. 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  PROCEDURE:  Procedure(s): REVISION OF TIBIAL POLYETHYLENE COMPONENT (Right)  SURGEON: Laurene Footman, MD  ASSISTANTS: Rachelle Hora, PA-C  ANESTHESIA:   spinal  EBL:  Total I/O In: 600 [I.V.:600] Out: 225 [Urine:200; Blood:25]  BLOOD ADMINISTERED:none  DRAINS: Incisional wound VAC   LOCAL MEDICATIONS USED:  MARCAINE    and OTHER morphine and Exparel  SPECIMEN:  No Specimen  DISPOSITION OF SPECIMEN:  N/A  COUNTS:  YES  TOURNIQUET:   Total Tourniquet Time Documented: Thigh (Right) - 36 minutes Total: Thigh (Right) - 36 minutes   IMPLANTS: Zimmer NexGen cruciate retaining anterior constrained 14 mm size yellow  DICTATION: .Dragon Dictation patient was brought to the operating room and after adequate spinal anesthesia was obtained the right leg was prepped and draped in the usual sterile fashion.  After patient identification and timeout procedures were completed, tourniquet was raised.  The prior midline incision was made followed by medial parapatellar arthrotomy.  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  Getting down to the joint surface there was clear breakage of the polyethylene component.  After adequate exposure and elevation of the medial capsule the prior polyethylene component was removed and there was some abrasion to the lateral femoral condyle but it did appear smooth.  After removing this implant the posterior aspect of the knee was debrided of a myelosis along the gutters.  The components were checked and there was no loosening of the femoral tibial component and the patella had not been resurfaced.  At this point the knee was thoroughly irrigated with pulsatile lavage and trial components were placed with  the component being size yellow 3 with this a initially being a 12 mm implant the 12 was quite loose and the 14 gave excellent stability in extension and mid flexion so that was chosen as the final component after Exparel was injected around the knee with Sensorcaine and morphine.  After inserting the polyethylene component and locking it with the device meant for this from the Zimmer tray the knee is placed to range of motion of the patella tracked well with no touch technique.  Tourniquet was let down and hemostasis achieved electrocautery the arthrotomy was repaired using a heavy Ethibond followed by a heavy Quill.  Next the subcutaneous tissue was reapproximated using a 3 OV lock followed by skin staples and Prevena incisional wound VAC.  Polar Care was then applied.  PLAN OF CARE: Discharge to home after PACU  PATIENT DISPOSITION:  PACU - hemodynamically stable.

## 2019-08-27 NOTE — Evaluation (Signed)
Physical Therapy Evaluation Patient Details Name: Kelly Pena MRN: [id number] DOB: 18-Apr-1941 Today's Date: 08/27/2019   History of Present Illness  Pt admitted for R knee revision. History of asthma, HTN, GERD, and renal disease.   Clinical Impression  Pt is a pleasant 78 year old female who was admitted for R knee revision. Pt performs bed mobility, transfers, and ambulation with cga and RW. RW given to patient and adjusted to pt height. Pt demonstrates deficits with strength/ROM/mobility/pain. Pt demonstrates ability to perform 10 SLRs with independence, therefore does not require KI for mobility. Pt is very motivated to participate and all questions answered. Able to ambulate to [facility] for toileting this session. Would benefit from skilled PT to address above deficits and promote optimal return to PLOF. Recommend transition to [facility] upon discharge from acute hospitalization. Planning for dc from PACU this date.      Follow Up Recommendations Home health PT    Equipment Recommendations  Rolling walker with 5" wheels    Recommendations for Other Services       Precautions / Restrictions Precautions Precautions: Fall;Knee Precaution Booklet Issued: Yes (comment) Restrictions Weight Bearing Restrictions: Yes RLE Weight Bearing: Weight bearing as tolerated      Mobility  Bed Mobility Overal bed mobility: Needs Assistance Bed Mobility: Supine to Sit     Supine to sit: Min guard     General bed mobility comments: slight assist for R LE and sequencing of hands. Once seated at EOB, able to sit with upright posture.  Transfers Overall transfer level: Needs assistance Equipment used: Rolling walker (2 wheeled) Transfers: Sit to/from Stand Sit to Stand: Min guard         General transfer comment: safe technique with cues for pushing from seated surface. Upright posture noted with symmetrical WBing  Ambulation/Gait Ambulation/Gait assistance: Min guard Gait Distance (Feet):  100 Feet Assistive device: Rolling walker (2 wheeled) Gait Pattern/deviations: Step-through pattern     General Gait Details: ambulated using reciprocal gait patten and safe technique. Cues to keep close to RW during turns. Chair follow provided by RN staff. Fatigue present. Reports pain at 4/10 with exertion  Stairs            Wheelchair Mobility    Modified Rankin (Stroke Patients Only)       Balance Overall balance assessment: Independent                                           Pertinent Vitals/Pain Pain Assessment: 0-10 Pain Score: 4  Pain Location: R knee Pain Descriptors / Indicators: Operative site guarding Pain Intervention(s): Limited activity within patient's tolerance;Ice applied;Repositioned    Home Living Family/patient expects to be discharged to:: Private residence Living Arrangements: Spouse/significant other Available Help at Discharge: Family Type of Home: Independent living facility Home Access: Level entry     Home Layout: One level Home Equipment: Walker - standard;Cane - single point      Prior Function Level of Independence: Independent         Comments: indep prior to admission, reports no falls     Hand Dominance        Extremity/Trunk Assessment   Upper Extremity Assessment Upper Extremity Assessment: Overall WFL for tasks assessed    Lower Extremity Assessment Lower Extremity Assessment: Generalized weakness(R LE grossly 4/5; L LE grossly 5/5)       Communication  Communication: No difficulties  Cognition Arousal/Alertness: Awake/alert Behavior During Therapy: WFL for tasks assessed/performed Overall Cognitive Status: Within Functional Limits for tasks assessed                                        General Comments      Exercises Total Joint Exercises Goniometric ROM: R knee AAROM: 0-85 degrees Other Exercises Other Exercises: supine/seated ther-ex performed on R LE  including AP, QS, SLRs, hip abd/add, heel slides, SAQ, LAQ, and seated knee flexion stretches. All ther-ex performed x 15 reps with cga. Written HEP given and reviewed. Other Exercises: Ambulated to [facility] with cga. Cues for sequencing. Safe technique with transfer on/off. Other Exercises: Education provided on polar care, safe home environment, wound vac, and car transfers.    Assessment/Plan    PT Assessment Patient needs continued PT services  PT Problem List Decreased strength;Decreased range of motion;Decreased balance;Decreased mobility;Decreased knowledge of use of DME;Pain       PT Treatment Interventions DME instruction;Gait training;Therapeutic exercise;Balance training    PT Goals (Current goals can be found in the Care Plan section)  Acute Rehab PT Goals Patient Stated Goal: to go home PT Goal Formulation: With patient Time For Goal Achievement: 09/10/19 Potential to Achieve Goals: Good    Frequency BID   Barriers to discharge        Co-evaluation               AM-PAC PT "6 Clicks" Mobility  Outcome Measure Help needed turning from your back to your side while in a flat bed without using bedrails?: None Help needed moving from lying on your back to sitting on the side of a flat bed without using bedrails?: A Little Help needed moving to and from a bed to a chair (including a wheelchair)?: A Little Help needed standing up from a chair using your arms (e.g., wheelchair or bedside chair)?: A Little Help needed to walk in hospital room?: A Little Help needed climbing 3-5 steps with a railing? : A Little 6 Click Score: 19    End of Session Equipment Utilized During Treatment: Gait belt Activity Tolerance: Patient tolerated treatment well Patient left: in chair Nurse Communication: Mobility status PT Visit Diagnosis: Muscle weakness (generalized) (M62.81);Difficulty in walking, not elsewhere classified (R26.2);Pain Pain - Right/Left: Right Pain - part of body:  Knee    Time: [id number] PT Time Calculation (min) (ACUTE ONLY): 60 min   Charges:   PT Evaluation $PT Eval Low Complexity: 1 Low PT Treatments $Gait Training: 8-22 mins $Therapeutic Exercise: 23-37 mins        Greggory Stallion, PT, DPT [phone]   Monai Hindes 08/27/2019, 2:41 PM

## 2019-08-27 NOTE — OR Nursing (Signed)
I spoke with Pensions consultant at [facility] and she reports she spoke with Rachelle Hora in Dr. Theodore Demark office and that it was okay for this patient's rehab to start next week. Tiffany assures me that PT will start going to this patient's home on Monday.

## 2019-09-03 NOTE — Discharge Summary (Signed)
Physician Discharge Summary  Patient ID: Kelly Pena MRN: [id number] DOB/AGE: 1941/01/15 78 y.o.  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 Anemia   . Arthritis   . Asthma    WELL CONTROLLED  . 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 Chronic kidney disease    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 BUT WAS FINALLY TAKEN OFF  . Dyspnea    DUE TO ASTHMA  . GERD (gastroesophageal reflux disease)   . Hypertension   . Hypothyroidism   . 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 Sarcoidosis      Transfusion: None.   Consultants (if any):   Discharged Condition: Improved  Hospital Course: Kelly Pena is an 78 y.o. 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    Surgeries: Procedure(s): REVISION OF TIBIAL POLYETHYLENE COMPONENT on 08/27/2019 Patient tolerated the surgery well. Taken to PACU where she was stabilized and then safely discharged home.  Instructed on how to properly injection Lovenox 40mg  daily.  Patient did work with physical therapy prior to discharge home after surgery.  Patient's IV was removed following surgery.  Implants: Zimmer NexGen cruciate retaining anterior constrained 14 mm size yellow She was given perioperative antibiotics:  Anti-infectives (From admission, onward)   Start     Dose/Rate Route Frequency Ordered Stop   08/27/19 1330  ceFAZolin (ANCEF) IVPB 2g/100 mL premix  Status:  Discontinued     2 g 200 mL/hr over 30 Minutes Intravenous Every 6 hours 08/27/19 0935 08/27/19 1959   08/27/19 0628  ceFAZolin (ANCEF) 2-4 GM/100ML-%  IVPB    Note to Pharmacy: [name]  : cabinet override      08/27/19 0628 08/27/19 0747   08/27/19 0600  ceFAZolin (ANCEF) IVPB 2g/100 mL premix     2 g 200 mL/hr over 30 Minutes Intravenous On call to O.R. 08/27/19 [id number] 08/27/19 0750    .  Recent vital signs:  Vitals:   08/27/19 1418 08/27/19 1628  BP: 111/69 (!) 147/70  Pulse: 67 60  Resp: 16 18  Temp: 98.2 F (36.8 C)   SpO2: 96% 98%    Recent laboratory studies:  Lab Results  Component Value Date   HGB 12.8 08/23/2019   Lab Results  Component Value Date   WBC 5.7 08/23/2019   PLT 176 08/23/2019   No results found for: INR Lab Results  Component Value Date   NA 139 08/23/2019   K 4.5 08/23/2019   CL 107 08/23/2019   CO2 21 (L) 08/23/2019   BUN 38 (H) 08/23/2019   CREATININE 1.86 (H) 08/23/2019   GLUCOSE 104 (H) 08/23/2019    Discharge Medications:   Allergies as of 08/27/2019      Reactions   Sulfur Shortness Of Breath   Cantaloupe (diagnostic) Diarrhea   Other    CATS-EXACERBATES ASTHMA   Hydrochlorothiazide Rash      Medication List    TAKE these medications   albuterol 108 (90 Base) MCG/ACT inhaler Commonly known as: VENTOLIN HFA Inhale 1-2 puffs into the lungs every 6 (six) hours as  needed for wheezing or shortness of breath.   amLODipine-benazepril 5-40 MG capsule Commonly known as: LOTREL Take 1 capsule by mouth every morning.   docusate sodium 100 MG capsule Commonly known as: Colace Take 1 capsule (100 mg total) by mouth 2 (two) times daily.   enoxaparin 40 MG/0.4ML injection Commonly known as: LOVENOX Inject 0.4 mLs (40 mg total) into the skin daily for 10 days.   fluticasone 50 MCG/ACT nasal spray Commonly known as: FLONASE Place 1 spray into both nostrils daily as needed for allergies or rhinitis.   Fluticasone-Salmeterol 250-50 MCG/DOSE Aepb Commonly known as: ADVAIR Inhale 1 puff into the lungs 2 (two) times daily.   levothyroxine 25 MCG tablet Commonly known  as: SYNTHROID Take 25 mcg by mouth daily before breakfast.   metoprolol succinate 100 MG 24 hr tablet Commonly known as: TOPROL-XL Take 100 mg by mouth every morning. Take with or immediately following a meal.   montelukast 10 MG tablet Commonly known as: SINGULAIR Take 10 mg by mouth at bedtime.   omeprazole 20 MG capsule Commonly known as: PRILOSEC Take 20 mg by mouth every morning.   oxyCODONE 5 MG immediate release tablet Commonly known as: Roxicodone Take 1-2 tablets (5-10 mg total) by mouth every 4 (four) hours as needed.      Diagnostic Studies: DG Knee 1-2 Views Right  Result Date: 08/27/2019 CLINICAL DATA:  Postop right knee arthroplasty. EXAM: RIGHT KNEE - 1-2 VIEW COMPARISON:  None. FINDINGS: Right knee prosthetic components appear well seated and well aligned. There is no acute fracture or evidence of an operative complication. IMPRESSION: Well-positioned total right knee arthroplasty. Electronically Signed   By: Lajean Manes M.D.   On: 08/27/2019 09:32   Disposition: Patient was discharged home after surgery.  Follow-up Information    Hessie Knows, MD Follow up on 09/10/2019.   Specialty: Orthopedic Surgery Why: @ 2:15 pm with Rachelle Hora, PA for wound re-check, For suture removal Contact information: [place] [address] [phone]          Signed: Judson Roch PA-C 09/03/2019, 2:25 PM

## 2019-09-11 ENCOUNTER — Other Ambulatory Visit: Payer: Self-pay | Admitting: Family Medicine

## 2019-09-11 DIAGNOSIS — Z1231 Encounter for screening mammogram for malignant neoplasm of breast: Secondary | ICD-10-CM

## 2019-10-04 ENCOUNTER — Ambulatory Visit
Admission: RE | Admit: 2019-10-04 | Discharge: 2019-10-04 | Disposition: A | Discharge status: Home / Self Care | Payer: Medicare Other | Source: Ambulatory Visit | Attending: Family Medicine | Admitting: Family Medicine

## 2019-10-04 DIAGNOSIS — Z1231 Encounter for screening mammogram for malignant neoplasm of breast: Secondary | ICD-10-CM

## 2020-10-28 ENCOUNTER — Other Ambulatory Visit: Payer: Self-pay | Admitting: Family Medicine

## 2020-10-28 DIAGNOSIS — Z1231 Encounter for screening mammogram for malignant neoplasm of breast: Secondary | ICD-10-CM

## 2020-11-18 ENCOUNTER — Other Ambulatory Visit: Payer: Self-pay

## 2020-11-18 ENCOUNTER — Ambulatory Visit
Admission: RE | Admit: 2020-11-18 | Discharge: 2020-11-18 | Disposition: A | Discharge status: Home / Self Care | Payer: Medicare Other | Source: Ambulatory Visit | Attending: Family Medicine | Admitting: Family Medicine

## 2020-11-18 DIAGNOSIS — Z1231 Encounter for screening mammogram for malignant neoplasm of breast: Secondary | ICD-10-CM | POA: Diagnosis not present

## 2021-03-18 ENCOUNTER — Other Ambulatory Visit: Payer: Self-pay | Admitting: Orthopedic Surgery

## 2021-03-18 DIAGNOSIS — Z96651 Presence of right artificial knee joint: Secondary | ICD-10-CM

## 2021-03-18 DIAGNOSIS — T84022A Instability of internal right knee prosthesis, initial encounter: Secondary | ICD-10-CM

## 2021-03-31 ENCOUNTER — Encounter
Admission: RE | Admit: 2021-03-31 | Discharge: 2021-03-31 | Disposition: A | Discharge status: Home / Self Care | Payer: Medicare Other | Source: Ambulatory Visit | Attending: Orthopedic Surgery | Admitting: Orthopedic Surgery

## 2021-03-31 ENCOUNTER — Other Ambulatory Visit: Payer: Self-pay

## 2021-03-31 DIAGNOSIS — T84022A Instability of internal right knee prosthesis, initial encounter: Secondary | ICD-10-CM

## 2021-03-31 DIAGNOSIS — Z96651 Presence of right artificial knee joint: Secondary | ICD-10-CM

## 2021-03-31 MED ORDER — TECHNETIUM TC 99M MEDRONATE IV KIT
20.0000 | PACK | Freq: Once | INTRAVENOUS | Status: AC | PRN
  Administered 2021-03-31: 23.17 via INTRAVENOUS

## 2021-04-02 ENCOUNTER — Other Ambulatory Visit: Payer: Self-pay | Admitting: Orthopedic Surgery

## 2021-04-06 ENCOUNTER — Encounter
Admission: RE | Admit: 2021-04-06 | Discharge: 2021-04-06 | Disposition: A | Discharge status: Home / Self Care | Payer: Medicare Other | Source: Ambulatory Visit | Attending: Orthopedic Surgery | Admitting: Orthopedic Surgery

## 2021-04-06 ENCOUNTER — Other Ambulatory Visit: Payer: Self-pay

## 2021-04-06 ENCOUNTER — Other Ambulatory Visit: Admission: RE | Admit: 2021-04-06 | Payer: Medicare Other | Source: Ambulatory Visit

## 2021-04-06 DIAGNOSIS — Z20822 Contact with and (suspected) exposure to covid-19: Secondary | ICD-10-CM | POA: Insufficient documentation

## 2021-04-06 DIAGNOSIS — Z01818 Encounter for other preprocedural examination: Secondary | ICD-10-CM | POA: Insufficient documentation

## 2021-04-06 LAB — CBC WITH DIFFERENTIAL/PLATELET
Abs Immature Granulocytes: 0.02 10*3/uL (ref 0.00–0.07)
Basophils Absolute: 0.1 10*3/uL (ref 0.0–0.1)
Basophils Relative: 2 %
Eosinophils Absolute: 0.2 10*3/uL (ref 0.0–0.5)
Eosinophils Relative: 3 %
HCT: 35.8 % — ABNORMAL LOW (ref 36.0–46.0)
Hemoglobin: 11.9 g/dL — ABNORMAL LOW (ref 12.0–15.0)
Immature Granulocytes: 0 %
Lymphocytes Relative: 26 %
Lymphs Abs: 1.4 10*3/uL (ref 0.7–4.0)
MCH: 29.8 pg (ref 26.0–34.0)
MCHC: 33.2 g/dL (ref 30.0–36.0)
MCV: 89.5 fL (ref 80.0–100.0)
Monocytes Absolute: 0.6 10*3/uL (ref 0.1–1.0)
Monocytes Relative: 10 %
Neutro Abs: 3.2 10*3/uL (ref 1.7–7.7)
Neutrophils Relative %: 59 %
Platelets: 182 10*3/uL (ref 150–400)
RBC: 4 MIL/uL (ref 3.87–5.11)
RDW: 14 % (ref 11.5–15.5)
WBC: 5.5 10*3/uL (ref 4.0–10.5)
nRBC: 0 % (ref 0.0–0.2)

## 2021-04-06 LAB — COMPREHENSIVE METABOLIC PANEL
ALT: 20 U/L (ref 0–44)
AST: 18 U/L (ref 15–41)
Albumin: 3.9 g/dL (ref 3.5–5.0)
Alkaline Phosphatase: 61 U/L (ref 38–126)
Anion gap: 7 (ref 5–15)
BUN: 28 mg/dL — ABNORMAL HIGH (ref 8–23)
CO2: 23 mmol/L (ref 22–32)
Calcium: 9.4 mg/dL (ref 8.9–10.3)
Chloride: 108 mmol/L (ref 98–111)
Creatinine, Ser: 1.36 mg/dL — ABNORMAL HIGH (ref 0.44–1.00)
GFR, Estimated: 40 mL/min — ABNORMAL LOW (ref >60)
Glucose, Bld: 103 mg/dL — ABNORMAL HIGH (ref 70–99)
Potassium: 4.1 mmol/L (ref 3.5–5.1)
Sodium: 138 mmol/L (ref 135–145)
Total Bilirubin: 0.8 mg/dL (ref 0.3–1.2)
Total Protein: 6.5 g/dL (ref 6.5–8.1)

## 2021-04-06 LAB — SURGICAL PCR SCREEN
MRSA, PCR: NEGATIVE
Staphylococcus aureus: NEGATIVE

## 2021-04-06 LAB — URINALYSIS, ROUTINE W REFLEX MICROSCOPIC
Bilirubin Urine: NEGATIVE
Glucose, UA: NEGATIVE mg/dL
Hgb urine dipstick: NEGATIVE
Ketones, ur: NEGATIVE mg/dL
Leukocytes,Ua: NEGATIVE
Nitrite: NEGATIVE
Protein, ur: NEGATIVE mg/dL
Specific Gravity, Urine: 1.015 (ref 1.005–1.030)
pH: 6 (ref 5.0–8.0)

## 2021-04-06 LAB — TYPE AND SCREEN
ABO/RH(D): O POS
Antibody Screen: NEGATIVE

## 2021-04-06 NOTE — Patient Instructions (Signed)
Your procedure is scheduled on: 04/08/21 Report to [place]. To find out your arrival time please call [phone] between 1PM - 3PM on 04/07/21.  Remember: Instructions that are not followed completely may result in serious medical risk, up to and including death, or upon the discretion of your surgeon and anesthesiologist your surgery may need to be rescheduled.     _X__ 1. Do not eat food after midnight the night before your procedure.                 No gum chewing or hard candies. You may drink clear liquids up to 2 hours                 before you are scheduled to arrive for your surgery- DO not drink clear                 liquids within 2 hours of the start of your surgery.                 Clear Liquids include:  water, apple juice without pulp, clear carbohydrate                 drink such as Clearfast or Gatorade, Black Coffee or Tea (Do not add                 anything to coffee or tea). Diabetics water only  __X__2.  On the morning of surgery brush your teeth with toothpaste and water, you                 may rinse your mouth with mouthwash if you wish.  Do not swallow any              toothpaste of mouthwash.     _X__ 3.  No Alcohol for 24 hours before or after surgery.   _X__ 4.  Do Not Smoke or use e-cigarettes For 24 Hours Prior to Your Surgery.                 Do not use any chewable tobacco products for at least 6 hours prior to                 surgery.  ____  5.  Bring all medications with you on the day of surgery if instructed.   __X__  6.  Notify your doctor if there is any change in your medical condition      (cold, fever, infections).     Do not wear jewelry, make-up, hairpins, clips or nail polish. Do not wear lotions, powders, or perfumes.  Do not shave 48 hours prior to surgery. Men may shave face and neck. Do not bring valuables to the hospital.    [facility] is not responsible for any belongings or  valuables.  Contacts, dentures/partials or body piercings may not be worn into surgery. Bring a case for your contacts, glasses or hearing aids, a denture cup will be supplied. Leave your suitcase in the car. After surgery it may be brought to your room. For patients admitted to the hospital, discharge time is determined by your treatment team.   Patients discharged the day of surgery will not be allowed to drive home.   Please read over the following fact sheets that you were given:   MRSA Information, Ensure drink, CHg soap,  __X__ Take these medicines the morning of surgery with A SIP  OF WATER:    1. levothyroxine (SYNTHROID) 25 MCG tablet  2. metoprolol succinate (TOPROL-XL) 100 MG 24 hr tablet  3. omeprazole (PRILOSEC) 20 MG capsule  4.  5.  6.  ____ Fleet Enema (as directed)   __X__ Use CHG Soap/SAGE wipes as directed  __X__ Use inhalers on the day of surgery  ____ Stop metformin/Janumet/Farxiga 2 days prior to surgery    ____ Take 1/2 of usual insulin dose the night before surgery. No insulin the morning          of surgery.   ____ Stop Blood Thinners Coumadin/Plavix/Xarelto/Pleta/Pradaxa/Eliquis/Effient/Aspirin  on   Or contact your Surgeon, Cardiologist or Medical Doctor regarding  ability to stop your blood thinners  __X__ Stop Anti-inflammatories 7 days before surgery such as Advil, Ibuprofen, Motrin,  BC or Goodies Powder, Naprosyn, Naproxen, Aleve, Aspirin    __X__ Stop all herbal supplements, fish oil or vitamin E until after surgery.    ____ Bring C-Pap to the hospital.

## 2021-04-07 ENCOUNTER — Other Ambulatory Visit: Payer: Medicare Other

## 2021-04-07 LAB — SARS CORONAVIRUS 2 (TAT 6-24 HRS): SARS Coronavirus 2: NEGATIVE

## 2021-04-08 ENCOUNTER — Inpatient Hospital Stay
Admission: RE | Admit: 2021-04-08 | Discharge: 2021-04-10 | DRG: 468 | Disposition: A | Discharge status: Home Health | Payer: Medicare Other | Attending: Orthopedic Surgery | Admitting: Orthopedic Surgery

## 2021-04-08 ENCOUNTER — Inpatient Hospital Stay: Payer: Medicare Other

## 2021-04-08 ENCOUNTER — Encounter: Payer: Self-pay | Admitting: Orthopedic Surgery

## 2021-04-08 ENCOUNTER — Other Ambulatory Visit: Payer: Self-pay

## 2021-04-08 ENCOUNTER — Inpatient Hospital Stay: Payer: Medicare Other | Admitting: Certified Registered"

## 2021-04-08 ENCOUNTER — Encounter
Admission: RE | Disposition: A | Discharge status: Home / Self Care | Payer: Self-pay | Source: Home / Self Care | Attending: Orthopedic Surgery

## 2021-04-08 DIAGNOSIS — Z79899 Other long term (current) drug therapy: Secondary | ICD-10-CM | POA: Diagnosis not present

## 2021-04-08 DIAGNOSIS — Z803 Family history of malignant neoplasm of breast: Secondary | ICD-10-CM

## 2021-04-08 DIAGNOSIS — N1832 Chronic kidney disease, stage 3b: Secondary | ICD-10-CM | POA: Diagnosis present

## 2021-04-08 DIAGNOSIS — Z882 Allergy status to sulfonamides status: Secondary | ICD-10-CM

## 2021-04-08 DIAGNOSIS — Z6837 Body mass index (BMI) 37.0-37.9, adult: Secondary | ICD-10-CM

## 2021-04-08 DIAGNOSIS — Y838 Other surgical procedures as the cause of abnormal reaction of the patient, or of later complication, without mention of misadventure at the time of the procedure: Secondary | ICD-10-CM | POA: Diagnosis present

## 2021-04-08 DIAGNOSIS — Z888 Allergy status to other drugs, medicaments and biological substances status: Secondary | ICD-10-CM

## 2021-04-08 DIAGNOSIS — D869 Sarcoidosis, unspecified: Secondary | ICD-10-CM | POA: Diagnosis present

## 2021-04-08 DIAGNOSIS — T84062A Wear of articular bearing surface of internal prosthetic right knee joint, initial encounter: Principal | ICD-10-CM | POA: Diagnosis present

## 2021-04-08 DIAGNOSIS — K219 Gastro-esophageal reflux disease without esophagitis: Secondary | ICD-10-CM | POA: Diagnosis present

## 2021-04-08 DIAGNOSIS — Z79890 Hormone replacement therapy (postmenopausal): Secondary | ICD-10-CM | POA: Diagnosis not present

## 2021-04-08 DIAGNOSIS — G8918 Other acute postprocedural pain: Secondary | ICD-10-CM

## 2021-04-08 DIAGNOSIS — J45909 Unspecified asthma, uncomplicated: Secondary | ICD-10-CM | POA: Diagnosis present

## 2021-04-08 DIAGNOSIS — Z7951 Long term (current) use of inhaled steroids: Secondary | ICD-10-CM

## 2021-04-08 DIAGNOSIS — Z90710 Acquired absence of both cervix and uterus: Secondary | ICD-10-CM | POA: Diagnosis not present

## 2021-04-08 DIAGNOSIS — Z853 Personal history of malignant neoplasm of breast: Secondary | ICD-10-CM

## 2021-04-08 DIAGNOSIS — Z8249 Family history of ischemic heart disease and other diseases of the circulatory system: Secondary | ICD-10-CM

## 2021-04-08 DIAGNOSIS — Z96652 Presence of left artificial knee joint: Secondary | ICD-10-CM | POA: Diagnosis present

## 2021-04-08 DIAGNOSIS — Z20822 Contact with and (suspected) exposure to covid-19: Secondary | ICD-10-CM | POA: Diagnosis present

## 2021-04-08 DIAGNOSIS — E039 Hypothyroidism, unspecified: Secondary | ICD-10-CM | POA: Diagnosis present

## 2021-04-08 DIAGNOSIS — Z923 Personal history of irradiation: Secondary | ICD-10-CM

## 2021-04-08 DIAGNOSIS — I129 Hypertensive chronic kidney disease with stage 1 through stage 4 chronic kidney disease, or unspecified chronic kidney disease: Secondary | ICD-10-CM | POA: Diagnosis present

## 2021-04-08 DIAGNOSIS — M25561 Pain in right knee: Secondary | ICD-10-CM | POA: Diagnosis present

## 2021-04-08 HISTORY — PX: TOTAL KNEE REVISION: SHX996

## 2021-04-08 LAB — CBC
HCT: 29.7 % — ABNORMAL LOW (ref 36.0–46.0)
Hemoglobin: 9.8 g/dL — ABNORMAL LOW (ref 12.0–15.0)
MCH: 29.9 pg (ref 26.0–34.0)
MCHC: 33 g/dL (ref 30.0–36.0)
MCV: 90.5 fL (ref 80.0–100.0)
Platelets: 176 10*3/uL (ref 150–400)
RBC: 3.28 MIL/uL — ABNORMAL LOW (ref 3.87–5.11)
RDW: 14.1 % (ref 11.5–15.5)
WBC: 10.6 10*3/uL — ABNORMAL HIGH (ref 4.0–10.5)
nRBC: 0 % (ref 0.0–0.2)

## 2021-04-08 LAB — CREATININE, SERUM
Creatinine, Ser: 1.33 mg/dL — ABNORMAL HIGH (ref 0.44–1.00)
GFR, Estimated: 41 mL/min — ABNORMAL LOW (ref >60)

## 2021-04-08 SURGERY — TOTAL KNEE REVISION
Anesthesia: Spinal | Site: Knee | Laterality: Right

## 2021-04-08 MED ORDER — SODIUM CHLORIDE 0.9 % IV SOLN: INTRAVENOUS | Status: DC

## 2021-04-08 MED ORDER — PROPOFOL 1000 MG/100ML IV EMUL
INTRAVENOUS | Status: AC
  Filled 2021-04-08: qty 100

## 2021-04-08 MED ORDER — ACETAMINOPHEN 10 MG/ML IV SOLN
INTRAVENOUS | Status: AC
  Filled 2021-04-08: qty 100

## 2021-04-08 MED ORDER — BUPIVACAINE-EPINEPHRINE (PF) 0.25% -1:200000 IJ SOLN
INTRAMUSCULAR | Status: AC
  Filled 2021-04-08: qty 30

## 2021-04-08 MED ORDER — SODIUM CHLORIDE FLUSH 0.9 % IV SOLN
INTRAVENOUS | Status: AC
  Filled 2021-04-08: qty 40

## 2021-04-08 MED ORDER — ACETAMINOPHEN 10 MG/ML IV SOLN: 1000.0000 mg | Freq: Once | INTRAVENOUS | Status: DC | PRN

## 2021-04-08 MED ORDER — SCOPOLAMINE 1 MG/3DAYS TD PT72
1.0000 | MEDICATED_PATCH | TRANSDERMAL | Status: DC
  Administered 2021-04-08: 1.5 mg via TRANSDERMAL
  Filled 2021-04-08: qty 1

## 2021-04-08 MED ORDER — 0.9 % SODIUM CHLORIDE (POUR BTL) OPTIME
TOPICAL | Status: DC | PRN
  Administered 2021-04-08: 1000 mL

## 2021-04-08 MED ORDER — BISACODYL 10 MG RE SUPP: 10.0000 mg | Freq: Every day | RECTAL | Status: DC | PRN

## 2021-04-08 MED ORDER — MORPHINE SULFATE (PF) 2 MG/ML IV SOLN
0.5000 mg | INTRAVENOUS | Status: DC | PRN
  Administered 2021-04-08: 1 mg via INTRAVENOUS
  Filled 2021-04-08: qty 1

## 2021-04-08 MED ORDER — AMLODIPINE BESYLATE 10 MG PO TABS
10.0000 mg | ORAL_TABLET | Freq: Every day | ORAL | Status: DC
  Administered 2021-04-09 – 2021-04-10 (×2): 10 mg via ORAL
  Filled 2021-04-08 (×3): qty 1

## 2021-04-08 MED ORDER — NEOMYCIN-POLYMYXIN B GU 40-200000 IR SOLN
Status: AC
  Filled 2021-04-08: qty 20

## 2021-04-08 MED ORDER — MORPHINE SULFATE (PF) 10 MG/ML IV SOLN
INTRAVENOUS | Status: DC | PRN
  Administered 2021-04-08: 10 mg

## 2021-04-08 MED ORDER — BUPIVACAINE LIPOSOME 1.3 % IJ SUSP
INTRAMUSCULAR | Status: AC
  Filled 2021-04-08: qty 20

## 2021-04-08 MED ORDER — LACTATED RINGERS IV SOLN: INTRAVENOUS | Status: DC

## 2021-04-08 MED ORDER — DIPHENHYDRAMINE HCL 12.5 MG/5ML PO ELIX
12.5000 mg | ORAL_SOLUTION | ORAL | Status: DC | PRN
Start: 2021-04-08 — End: 2021-04-10

## 2021-04-08 MED ORDER — METOPROLOL SUCCINATE ER 25 MG PO TB24
100.0000 mg | ORAL_TABLET | ORAL | Status: DC
  Administered 2021-04-09 – 2021-04-10 (×2): 100 mg via ORAL
  Filled 2021-04-08 (×2): qty 4

## 2021-04-08 MED ORDER — ONDANSETRON HCL 4 MG/2ML IJ SOLN: 4.0000 mg | Freq: Four times a day (QID) | INTRAMUSCULAR | Status: DC | PRN

## 2021-04-08 MED ORDER — FLEET ENEMA 7-19 GM/118ML RE ENEM: 1.0000 | ENEMA | Freq: Once | RECTAL | Status: DC | PRN

## 2021-04-08 MED ORDER — METHOCARBAMOL 1000 MG/10ML IJ SOLN
500.0000 mg | Freq: Four times a day (QID) | INTRAVENOUS | Status: DC | PRN
  Filled 2021-04-08: qty 5

## 2021-04-08 MED ORDER — ALUM & MAG HYDROXIDE-SIMETH 200-200-20 MG/5ML PO SUSP
30.0000 mL | ORAL | Status: DC | PRN
  Administered 2021-04-09: 30 mL via ORAL
  Filled 2021-04-08: qty 30

## 2021-04-08 MED ORDER — ONDANSETRON HCL 4 MG PO TABS: 4.0000 mg | ORAL_TABLET | Freq: Four times a day (QID) | ORAL | Status: DC | PRN

## 2021-04-08 MED ORDER — CEFAZOLIN SODIUM-DEXTROSE 2-4 GM/100ML-% IV SOLN
INTRAVENOUS | Status: AC
  Filled 2021-04-08: qty 100

## 2021-04-08 MED ORDER — ONDANSETRON HCL 4 MG/2ML IJ SOLN: 4.0000 mg | Freq: Once | INTRAMUSCULAR | Status: DC | PRN

## 2021-04-08 MED ORDER — MOMETASONE FURO-FORMOTEROL FUM 200-5 MCG/ACT IN AERO
2.0000 | INHALATION_SPRAY | Freq: Two times a day (BID) | RESPIRATORY_TRACT | Status: DC
  Administered 2021-04-08 – 2021-04-10 (×3): 2 via RESPIRATORY_TRACT
  Filled 2021-04-08: qty 8.8

## 2021-04-08 MED ORDER — DOCUSATE SODIUM 100 MG PO CAPS
100.0000 mg | ORAL_CAPSULE | Freq: Two times a day (BID) | ORAL | Status: DC
  Administered 2021-04-08 – 2021-04-10 (×5): 100 mg via ORAL
  Filled 2021-04-08 (×5): qty 1

## 2021-04-08 MED ORDER — CHLORHEXIDINE GLUCONATE 0.12 % MT SOLN: 15.0000 mL | Freq: Once | OROMUCOSAL | Status: AC

## 2021-04-08 MED ORDER — MORPHINE SULFATE (PF) 10 MG/ML IV SOLN
INTRAVENOUS | Status: AC
  Filled 2021-04-08: qty 1

## 2021-04-08 MED ORDER — ORAL CARE MOUTH RINSE: 15.0000 mL | Freq: Once | OROMUCOSAL | Status: AC

## 2021-04-08 MED ORDER — HYDROCODONE-ACETAMINOPHEN 5-325 MG PO TABS
1.0000 | ORAL_TABLET | ORAL | Status: DC | PRN
  Administered 2021-04-08: 1 via ORAL
  Administered 2021-04-09: 2 via ORAL
  Administered 2021-04-09: 1 via ORAL
  Administered 2021-04-09: 2 via ORAL
  Filled 2021-04-08: qty 2
  Filled 2021-04-08: qty 1
  Filled 2021-04-08: qty 2

## 2021-04-08 MED ORDER — POLYETHYLENE GLYCOL 3350 17 G PO PACK: 17.0000 g | PACK | Freq: Every day | ORAL | Status: DC | PRN

## 2021-04-08 MED ORDER — VANCOMYCIN HCL 1000 MG IV SOLR
INTRAVENOUS | Status: AC
  Filled 2021-04-08: qty 1000

## 2021-04-08 MED ORDER — GLYCOPYRROLATE 0.2 MG/ML IJ SOLN
INTRAMUSCULAR | Status: DC | PRN
  Administered 2021-04-08: .2 mg via INTRAVENOUS

## 2021-04-08 MED ORDER — SODIUM CHLORIDE 0.9 % IV SOLN
INTRAVENOUS | Status: DC | PRN
  Administered 2021-04-08: 30 ug/min via INTRAVENOUS

## 2021-04-08 MED ORDER — ALBUTEROL SULFATE HFA 108 (90 BASE) MCG/ACT IN AERS: 1.0000 | INHALATION_SPRAY | Freq: Four times a day (QID) | RESPIRATORY_TRACT | Status: DC | PRN

## 2021-04-08 MED ORDER — BUPIVACAINE HCL (PF) 0.5 % IJ SOLN
INTRAMUSCULAR | Status: DC | PRN
  Administered 2021-04-08: 2.5 mL via INTRATHECAL

## 2021-04-08 MED ORDER — MONTELUKAST SODIUM 10 MG PO TABS
10.0000 mg | ORAL_TABLET | Freq: Every day | ORAL | Status: DC
  Administered 2021-04-08 – 2021-04-09 (×2): 10 mg via ORAL
  Filled 2021-04-08 (×2): qty 1

## 2021-04-08 MED ORDER — MIDAZOLAM HCL 2 MG/2ML IJ SOLN
INTRAMUSCULAR | Status: AC
  Filled 2021-04-08: qty 2

## 2021-04-08 MED ORDER — BENAZEPRIL HCL 20 MG PO TABS
40.0000 mg | ORAL_TABLET | Freq: Every day | ORAL | Status: DC
  Administered 2021-04-09 – 2021-04-10 (×2): 40 mg via ORAL
  Filled 2021-04-08 (×3): qty 2

## 2021-04-08 MED ORDER — HYDROCODONE-ACETAMINOPHEN 7.5-325 MG PO TABS
1.0000 | ORAL_TABLET | ORAL | Status: DC | PRN
  Administered 2021-04-10: 1 via ORAL
  Filled 2021-04-08: qty 1

## 2021-04-08 MED ORDER — BUPIVACAINE-EPINEPHRINE 0.25% -1:200000 IJ SOLN
INTRAMUSCULAR | Status: DC | PRN
  Administered 2021-04-08: 30 mL

## 2021-04-08 MED ORDER — ACETAMINOPHEN 10 MG/ML IV SOLN
INTRAVENOUS | Status: DC | PRN
  Administered 2021-04-08: 1000 mg via INTRAVENOUS

## 2021-04-08 MED ORDER — PHENYLEPHRINE HCL (PRESSORS) 10 MG/ML IV SOLN
INTRAVENOUS | Status: AC
  Filled 2021-04-08: qty 1

## 2021-04-08 MED ORDER — ZOLPIDEM TARTRATE 5 MG PO TABS
5.0000 mg | ORAL_TABLET | Freq: Every evening | ORAL | Status: DC | PRN
  Administered 2021-04-08: 5 mg via ORAL
  Filled 2021-04-08: qty 1

## 2021-04-08 MED ORDER — PROPOFOL 500 MG/50ML IV EMUL
INTRAVENOUS | Status: DC | PRN
  Administered 2021-04-08: 90 ug/kg/min via INTRAVENOUS

## 2021-04-08 MED ORDER — ALBUTEROL SULFATE (2.5 MG/3ML) 0.083% IN NEBU: 2.5000 mg | INHALATION_SOLUTION | Freq: Four times a day (QID) | RESPIRATORY_TRACT | Status: DC | PRN

## 2021-04-08 MED ORDER — FENTANYL CITRATE (PF) 100 MCG/2ML IJ SOLN
INTRAMUSCULAR | Status: AC
  Filled 2021-04-08: qty 2

## 2021-04-08 MED ORDER — CEFAZOLIN SODIUM-DEXTROSE 2-4 GM/100ML-% IV SOLN
2.0000 g | Freq: Three times a day (TID) | INTRAVENOUS | Status: AC
Start: 2021-04-08 — End: 2021-04-08
  Administered 2021-04-08 (×2): 2 g via INTRAVENOUS
  Filled 2021-04-08 (×2): qty 100

## 2021-04-08 MED ORDER — CEFAZOLIN SODIUM-DEXTROSE 2-4 GM/100ML-% IV SOLN
2.0000 g | INTRAVENOUS | Status: AC
  Administered 2021-04-08: 2 g via INTRAVENOUS

## 2021-04-08 MED ORDER — MENTHOL 3 MG MT LOZG
1.0000 | LOZENGE | OROMUCOSAL | Status: DC | PRN
  Filled 2021-04-08: qty 9

## 2021-04-08 MED ORDER — ENOXAPARIN SODIUM 30 MG/0.3ML IJ SOSY
30.0000 mg | PREFILLED_SYRINGE | Freq: Two times a day (BID) | INTRAMUSCULAR | Status: DC
  Administered 2021-04-09 – 2021-04-10 (×3): 30 mg via SUBCUTANEOUS
  Filled 2021-04-08 (×3): qty 0.3

## 2021-04-08 MED ORDER — PHENOL 1.4 % MT LIQD
1.0000 | OROMUCOSAL | Status: DC | PRN
  Filled 2021-04-08: qty 177

## 2021-04-08 MED ORDER — FENTANYL CITRATE (PF) 100 MCG/2ML IJ SOLN
INTRAMUSCULAR | Status: DC | PRN
  Administered 2021-04-08 (×2): 50 ug via INTRAVENOUS

## 2021-04-08 MED ORDER — LEVOTHYROXINE SODIUM 25 MCG PO TABS
25.0000 ug | ORAL_TABLET | Freq: Every day | ORAL | Status: DC
  Administered 2021-04-09 – 2021-04-10 (×2): 25 ug via ORAL
  Filled 2021-04-08 (×2): qty 1

## 2021-04-08 MED ORDER — HYDROCODONE-ACETAMINOPHEN 5-325 MG PO TABS
ORAL_TABLET | ORAL | Status: AC
  Filled 2021-04-08: qty 1

## 2021-04-08 MED ORDER — CHLORHEXIDINE GLUCONATE 0.12 % MT SOLN
OROMUCOSAL | Status: AC
  Administered 2021-04-08: 15 mL via OROMUCOSAL
  Filled 2021-04-08: qty 15

## 2021-04-08 MED ORDER — SODIUM CHLORIDE 0.9 % IV SOLN
INTRAVENOUS | Status: DC | PRN
  Administered 2021-04-08: 60 mL

## 2021-04-08 MED ORDER — METHOCARBAMOL 500 MG PO TABS
500.0000 mg | ORAL_TABLET | Freq: Four times a day (QID) | ORAL | Status: DC | PRN
  Administered 2021-04-08 – 2021-04-09 (×2): 500 mg via ORAL
  Filled 2021-04-08 (×2): qty 1

## 2021-04-08 MED ORDER — PANTOPRAZOLE SODIUM 40 MG PO TBEC
40.0000 mg | DELAYED_RELEASE_TABLET | Freq: Every day | ORAL | Status: DC
  Administered 2021-04-09 – 2021-04-10 (×2): 40 mg via ORAL
  Filled 2021-04-08 (×2): qty 1

## 2021-04-08 MED ORDER — PHENYLEPHRINE HCL (PRESSORS) 10 MG/ML IV SOLN
INTRAVENOUS | Status: DC | PRN
  Administered 2021-04-08 (×3): 100 ug via INTRAVENOUS

## 2021-04-08 MED ORDER — ACETAMINOPHEN 325 MG PO TABS: 325.0000 mg | ORAL_TABLET | Freq: Four times a day (QID) | ORAL | Status: DC | PRN

## 2021-04-08 MED ORDER — FENTANYL CITRATE (PF) 100 MCG/2ML IJ SOLN
25.0000 ug | INTRAMUSCULAR | Status: DC | PRN
  Administered 2021-04-08: 25 ug via INTRAVENOUS
  Administered 2021-04-08: 50 ug via INTRAVENOUS

## 2021-04-08 SURGICAL SUPPLY — 71 items
ADAPTER OFFSET 5 (Connector) ×2 IMPLANT
BLADE SAW 90X13X1.19 OSCILLAT (BLADE) ×2 IMPLANT
BLADE SAW 90X25X1.19 OSCILLAT (BLADE) ×2 IMPLANT
BNDG ELASTIC 6X5.8 VLCR STR LF (GAUZE/BANDAGES/DRESSINGS) ×2 IMPLANT
CANISTER SUCT 1200ML W/VALVE (MISCELLANEOUS) ×2 IMPLANT
CANISTER WOUND CARE 500ML ATS (WOUND CARE) ×2 IMPLANT
CEMENT HV SMART SET (Cement) ×4 IMPLANT
CHLORAPREP W/TINT 26 (MISCELLANEOUS) ×4 IMPLANT
COOLER POLAR GLACIER W/PUMP (MISCELLANEOUS) ×2 IMPLANT
COVER BACK TABLE REUSABLE LG (DRAPES) ×2 IMPLANT
CUFF TOURN SGL QUICK 24 (TOURNIQUET CUFF)
CUFF TOURN SGL QUICK 34 (TOURNIQUET CUFF)
CUFF TRNQT CYL 24X4X16.5-23 (TOURNIQUET CUFF) IMPLANT
CUFF TRNQT CYL 34X4.125X (TOURNIQUET CUFF) IMPLANT
DRAPE 3/4 80X56 (DRAPES) ×8 IMPLANT
ELECT CAUTERY BLADE 6.4 (BLADE) ×2 IMPLANT
ELECT REM PT RETURN 9FT ADLT (ELECTROSURGICAL) ×2
ELECTRODE REM PT RTRN 9FT ADLT (ELECTROSURGICAL) ×1 IMPLANT
FEM COMP RT PS 4 (Miscellaneous) ×2 IMPLANT
GAUZE 4X4 16PLY ~~LOC~~+RFID DBL (SPONGE) ×2 IMPLANT
GAUZE SPONGE 4X4 12PLY STRL (GAUZE/BANDAGES/DRESSINGS) ×2 IMPLANT
GAUZE XEROFORM 1X8 LF (GAUZE/BANDAGES/DRESSINGS) ×2 IMPLANT
GLOVE SURG ORTHO LTX SZ8 (GLOVE) ×2 IMPLANT
GLOVE SURG SYN 9.0  PF PI (GLOVE) ×1
GLOVE SURG SYN 9.0 PF PI (GLOVE) ×1 IMPLANT
GLOVE SURG UNDER LTX SZ8 (GLOVE) ×2 IMPLANT
GLOVE SURG UNDER POLY LF SZ9 (GLOVE) ×2 IMPLANT
GOWN SRG 2XL LVL 4 RGLN SLV (GOWNS) ×1 IMPLANT
GOWN STRL NON-REIN 2XL LVL4 (GOWNS) ×1
GOWN STRL REUS W/ TWL LRG LVL3 (GOWN DISPOSABLE) ×1 IMPLANT
GOWN STRL REUS W/ TWL XL LVL3 (GOWN DISPOSABLE) ×1 IMPLANT
GOWN STRL REUS W/TWL LRG LVL3 (GOWN DISPOSABLE) ×1
GOWN STRL REUS W/TWL XL LVL3 (GOWN DISPOSABLE) ×1
HOLDER FOLEY CATH W/STRAP (MISCELLANEOUS) ×2 IMPLANT
HOOD PEEL AWAY FLYTE STAYCOOL (MISCELLANEOUS) ×4 IMPLANT
INSERT TIBIAL KNEE FIX SZ3 17 (Knees) ×2 IMPLANT
IV NS IRRIG 3000ML ARTHROMATIC (IV SOLUTION) ×2 IMPLANT
KIT PREVENA INCISION MGT20CM45 (CANNISTER) ×2 IMPLANT
KIT STIMULAN RAPID CURE 5CC (Orthopedic Implant) ×2 IMPLANT
KIT TURNOVER KIT A (KITS) ×2 IMPLANT
KNIFE SCULPS 14X20 (INSTRUMENTS) ×2 IMPLANT
MANIFOLD NEPTUNE II (INSTRUMENTS) ×2 IMPLANT
NEEDLE SPNL 18GX3.5 QUINCKE PK (NEEDLE) ×2 IMPLANT
NEEDLE SPNL 20GX3.5 QUINCKE YW (NEEDLE) ×2 IMPLANT
NS IRRIG 1000ML POUR BTL (IV SOLUTION) ×2 IMPLANT
PACK TOTAL KNEE (MISCELLANEOUS) ×2 IMPLANT
PAD WRAPON POLAR KNEE (MISCELLANEOUS) ×2 IMPLANT
PATELLA RESURFACING MEDACTA 02 (Bone Implant) ×2 IMPLANT
PULSAVAC PLUS IRRIG FAN TIP (DISPOSABLE) ×2
SCALPEL PROTECTED #10 DISP (BLADE) ×4 IMPLANT
SPONGE T-LAP 18X18 ~~LOC~~+RFID (SPONGE) ×6 IMPLANT
STAPLER SKIN PROX 35W (STAPLE) ×2 IMPLANT
STEM EXTENSION 14 65 (Stem) ×2 IMPLANT
STEM EXTENSION FLUTED 12X65 (Stem) ×2 IMPLANT
SUCTION FRAZIER HANDLE 10FR (MISCELLANEOUS) ×1
SUCTION TUBE FRAZIER 10FR DISP (MISCELLANEOUS) ×1 IMPLANT
SUT DVC 2 QUILL PDO  T11 36X36 (SUTURE) ×1
SUT DVC 2 QUILL PDO T11 36X36 (SUTURE) ×1 IMPLANT
SUT TICRON 2-0 30IN 311381 (SUTURE) IMPLANT
SUT V-LOC 90 ABS DVC 3-0 CL (SUTURE) ×2 IMPLANT
SWAB CULTURE AMIES ANAERIB BLU (MISCELLANEOUS) IMPLANT
SYR 20ML LL LF (SYRINGE) ×2 IMPLANT
SYR 50ML LL SCALE MARK (SYRINGE) ×4 IMPLANT
TIBIAL REV AUGMENTATION SZ3 (Orthopedic Implant) ×4 IMPLANT
TIP FAN IRRIG PULSAVAC PLUS (DISPOSABLE) ×1 IMPLANT
TOWEL OR 17X26 4PK STRL BLUE (TOWEL DISPOSABLE) ×2 IMPLANT
TOWER CARTRIDGE SMART MIX (DISPOSABLE) ×2 IMPLANT
TRAY FOLEY MTR SLVR 16FR STAT (SET/KITS/TRAYS/PACK) ×2 IMPLANT
TRAY TIBIAL REV FIXED SZ3 R (Miscellaneous) ×2 IMPLANT
WEDGE FEMORAL REV DIST 4 S4 (Miscellaneous) ×4 IMPLANT
WRAPON POLAR PAD KNEE (MISCELLANEOUS) ×4

## 2021-04-08 NOTE — Progress Notes (Signed)
PATIENT IS DOING WELL. CALLED HER HUSBAND TO UPDATE AND LET HIM KNOW WHAT ROOM SHE IS IN.

## 2021-04-08 NOTE — Op Note (Signed)
04/08/2021  10:19 AM  PATIENT:  Kelly Pena  80 y.o. female  PRE-OPERATIVE DIAGNOSIS:  Failure of total knee arthroplasty, initial encounger  T84.018A, Z96.659  POST-OPERATIVE DIAGNOSIS:  Failure of total knee arthroplasty, initial encounger  T84.018A,   PROCEDURE:  Procedure(s): TOTAL KNEE REVISION (Right) all components  SURGEON: Laurene Footman, MD  ASSISTANTS: Rachelle Hora, PA-C  ANESTHESIA:   spinal  EBL:  Total I/O In: -  Out: 500 [Urine:350; Blood:150]  BLOOD ADMINISTERED:none  DRAINS:  Incisional wound VAC    LOCAL MEDICATIONS USED:  MARCAINE    and OTHER Exparel along with stimulan beads with vancomycin  SPECIMEN:  No Specimen  DISPOSITION OF SPECIMEN:  N/A  COUNTS:  YES  TOURNIQUET:  * Missing tourniquet times found for documented tourniquets in log: [id number] *  IMPLANTS: Medacta GM K revision system 3 tibia with 5 mm augments medial and lateral 12 x 65 mm stem for femur with 14 mm 65 x 8m intramedullary stem 4 mm augments medial distal and lateral distal to patella and 17 mm size 3 semiconstrained insert size 2 patella all components cemented  DICTATION: .Dragon Dictation  patient was brought to the operating room and after adequate general anesthesia was obtained the left leg was prepped and draped in the usual sterile fashion.   After patient identification and timeout procedures were completed the prior midline skin incision was made followed by medial parapatellar arthrotomy with extensive metallosis within the knee.  Careful exposure was carried out preserving the tibial attachment of the patellar tendon and the patella was slowly mobilized so that could be dislocated laterally and expose the joint.  The tibial insert was loosened and removed without difficulty.  Exposure showed the posterior lateral tibia metal had worn down and would no longer hold the polyethylene insert.  The femoral and tibial components were exposed and with use of thin flexible osteotomes  removed without difficulty with minimal loss of bone..  This femoral component sized to a size 4 looking at it compared to the revision system.  Proximal tibia cut was carried out after removing cement plug from the canal using cement removal device and hand reaming with the intramedullary guide proximal tibia cut was carried out to freshen up the proximal tibia.  This sized to a size 3 and there is a 3 mm offset with a baseplate with a basic set plan pinned.  Proximal reaming carried out followed by keel punch and trial left in place..  Femur was prepared with first drilling then progressive reaming to 14 mm the distal cut was carried out with a 4 cutting guide applied anterior and posterior and chamfer cuts were made with a no offset being appropriate.  Trial components were placed and the component was a little bit loose with a 17 mm insert so 5 mm augments were chosen for the tibial component with 4 mm augments area on the femur..  At this point the above local was injected throughout the joint.   The patella had not been read previously resurfaced and was prepared using standard technique using a caliper cutting the patella and then drilling through holes and sized to a size 2.  The tourniquet was raised after tibial preparation.  When the cement was mixed the tibial component was cemented in place first followed by the femoral component and a 14 mm trial.  The knee is held in extension as the cement with the patellar button clamped into place again using normal cement.  When  the cement had set and excess cement had been removed 17 mm trial gave better stability in flexion and extension was chosen for the final component final component was placed in position on the torque screwdriver used tightened the set screw.  Tourniquet was let down and the knee was irrigated with Betadine solution followed by pulse lavage then stimulant beads were placed in the medial lateral gutters followed by closure with heavy Tycron  followed by a Feliberto Gottron suture 3 OV lock and staples.  Prevena incisional wound VAC applied and patient sent recovery in stable condition  PLAN OF CARE: Admit to inpatient   PATIENT DISPOSITION:  PACU - hemodynamically stable.

## 2021-04-08 NOTE — Anesthesia Preprocedure Evaluation (Addendum)
Anesthesia Evaluation  Patient identified by MRN, date of birth, ID band Patient awake    Reviewed: Allergy & Precautions, NPO status , Patient's Chart, lab work & pertinent test results  History of Anesthesia Complications Negative for: history of anesthetic complications  Airway Mallampati: II  TM Distance: <3 FB Neck ROM: Full    Dental  (+) Teeth Intact, Implants, Caps   Pulmonary shortness of breath and with exertion, asthma , neg sleep apnea, neg COPD, Patient abstained from smoking.Not current smoker,    Pulmonary exam normal breath sounds clear to auscultation       Cardiovascular Exercise Tolerance: Good METShypertension, (-) CAD and (-) Past MI (-) dysrhythmias  Rhythm:Regular Rate:Normal - Systolic murmurs    Neuro/Psych negative neurological ROS  negative psych ROS   GI/Hepatic GERD  Medicated,(+)     (-) substance abuse  ,   Endo/Other  neg diabetesHypothyroidism   Renal/GU CRFRenal disease     Musculoskeletal  (+) Arthritis ,   Abdominal   Peds  Hematology  (+) anemia ,   Anesthesia Other Findings Past Medical History: No date: Anemia No date: Arthritis No date: Asthma     Comment:  WELL CONTROLLED 2009: Breast cancer ([place])     Comment:  right  No date: Cancer ([place])     Comment:  RIGHT BREAST No date: Chronic kidney disease     Comment:  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 BUT               WAS FINALLY TAKEN OFF No date: Dyspnea     Comment:  DUE TO ASTHMA No date: GERD (gastroesophageal reflux disease) No date: Hypertension No date: 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: Personal history of radiation therapy     Comment:  right breast No date: Sarcoidosis  Reproductive/Obstetrics                                                             Anesthesia Evaluation  Patient identified by MRN, date  of birth, ID band Patient awake    Reviewed: Allergy & Precautions, H&P , NPO status , Patient's Chart, lab work & pertinent test results, reviewed documented beta blocker date and time   History of Anesthesia Complications Negative for: history of anesthetic complications  Airway Mallampati: I  TM Distance: >3 FB Neck ROM: full    Dental  (+) Dental Advidsory Given, Caps, Teeth Intact   Pulmonary neg shortness of breath, asthma , neg sleep apnea, neg COPD, neg recent URI,    Pulmonary exam normal        Cardiovascular Exercise Tolerance: Good hypertension, (-) angina(-) Past MI and (-) Cardiac Stents Normal cardiovascular exam(-) dysrhythmias (-) Valvular Problems/Murmurs     Neuro/Psych negative neurological ROS  negative psych ROS   GI/Hepatic Neg liver ROS, GERD  ,  Endo/Other  neg diabetesHypothyroidism   Renal/GU CRFRenal disease  negative genitourinary   Musculoskeletal   Abdominal   Peds  Hematology negative hematology ROS (+)   Anesthesia Other Findings Past Medical History: No date: Anemia No date: Arthritis No date: Asthma     Comment:  WELL CONTROLLED No date: Cancer ([place])     Comment:  RIGHT BREAST No date:  Chronic kidney disease     Comment:  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 BUT               WAS FINALLY TAKEN OFF No date: Dyspnea     Comment:  DUE TO ASTHMA No date: GERD (gastroesophageal reflux disease) No date: Hypertension No date: 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 No date: Sarcoidosis   Reproductive/Obstetrics negative OB ROS                             Anesthesia Physical Anesthesia Plan  ASA: III  Anesthesia Plan: Spinal   Post-op Pain Management:    Induction:   PONV Risk Score and Plan: 2 and Propofol infusion and TIVA  Airway Management Planned: Natural Airway and Simple Face Mask  Additional Equipment:    Intra-op Plan:   Post-operative Plan:   Informed Consent: I have reviewed the patients History and Physical, chart, labs and discussed the procedure including the risks, benefits and alternatives for the proposed anesthesia with the patient or authorized representative who has indicated his/her understanding and acceptance.     Dental Advisory Given  Plan Discussed with: Anesthesiologist, CRNA and Surgeon  Anesthesia Plan Comments:         Anesthesia Quick Evaluation  Anesthesia Physical Anesthesia Plan  ASA: 2  Anesthesia Plan: Spinal   Post-op Pain Management:    Induction: Intravenous  PONV Risk Score and Plan: 3 and Ondansetron, Dexamethasone and Treatment may vary due to age or medical condition  Airway Management Planned: Natural Airway  Additional Equipment: None  Intra-op Plan:   Post-operative Plan:   Informed Consent: I have reviewed the patients History and Physical, chart, labs and discussed the procedure including the risks, benefits and alternatives for the proposed anesthesia with the patient or authorized representative who has indicated his/her understanding and acceptance.       Plan Discussed with: CRNA and Surgeon  Anesthesia Plan Comments: (Discussed R/B/A of neuraxial anesthesia technique with patient: - rare risks of spinal/epidural hematoma, nerve damage, infection - Risk of PDPH - Risk of nausea and vomiting - Risk of conversion to general anesthesia and its associated risks, including sore throat, damage to lips/teeth/oropharynx, and rare risks such as cardiac and respiratory events. - Risk of allergic reactions Patient voiced understanding.)        Anesthesia Quick Evaluation

## 2021-04-08 NOTE — Evaluation (Signed)
Physical Therapy Evaluation Patient Details Name: Kelly Pena MRN: [id number] DOB: 1941/06/23 Today's Date: 04/08/2021   History of Present Illness  Pt admitted for second R TKA revision on 8/4. Pt admitted on 12/22 for previous R knee revision. PMH includes asthma, HTN, GERD, and renal disease.   Clinical Impression  Pt admitted with above diagnosis. Pt received supine in bed with HOB elevated with spouse in room. Pt reporting 5/10 pain in R knee but agreeable to PT services. Reports slight nausea. At baseline pt mod-I for ADL's and ambulation with 4WW. Pt resides at [facility] with spouse so home is ADL compliant with adequate DME from previous TKA and revision. Pt supervision for mobility to date for t/f from supine to seated EOB with HOB elevated and need for bed rails. STS to 4WW with supervision. Pt tolerated standing ~ 1 minute prior to reporting dizziness and need to vomit so returned to supine with supervision. RN notified and BP assessed and vomit bag given to pt. BP once supine 121/78 mm Hg with no further reports of dizziness once laying down. Further mobility deferred. Pt educated on supine LE exercises pt can perform prior to next PT session. Due to only requiring supervision for standing and able to perform x10 SLR with no quad lag, anticipate once nausea clears pt will be safe to return to home environment with HHPT. Pt currently with functional limitations due to the deficits listed below (see PT Problem List). Pt will benefit from skilled PT to increase their independence and safety with mobility to allow discharge to the venue listed below.     Follow Up Recommendations Home health PT;Supervision for mobility/OOB    Equipment Recommendations       Recommendations for Other Services       Precautions / Restrictions Precautions Precautions: Fall Restrictions Weight Bearing Restrictions: Yes RLE Weight Bearing: Weight bearing as tolerated      Mobility  Bed  Mobility Overal bed mobility: Needs Assistance Bed Mobility: Supine to Sit     Supine to sit: Supervision;HOB elevated          Transfers   Equipment used: 4-wheeled walker                Ambulation/Gait             General Gait Details: deferred to dizziness  Stairs            Wheelchair Mobility    Modified Rankin (Stroke Patients Only)       Balance Overall balance assessment: Needs assistance Sitting-balance support: No upper extremity supported;Feet supported Sitting balance-Leahy Scale: Good       Standing balance-Leahy Scale: Fair Standing balance comment: Relies on [id number] for balance.                             Pertinent Vitals/Pain Pain Assessment: 0-10 Pain Score: 5  Pain Descriptors / Indicators: Aching;Guarding Pain Intervention(s): Limited activity within patient's tolerance;Monitored during session;Repositioned    Home Living Family/patient expects to be discharged to:: Private residence Living Arrangements: Spouse/significant other Available Help at Discharge: Family Type of Home: Independent living facility Home Access: Level entry     Home Layout: One level Home Equipment: Environmental consultant - 2 wheels;Walker - 4 wheels;Cane - single point      Prior Function Level of Independence: Independent with assistive device(s)         Comments: Indep prior to admission with rollator.  Hand Dominance        Extremity/Trunk Assessment   Upper Extremity Assessment Upper Extremity Assessment: Overall WFL for tasks assessed;Defer to OT evaluation    Lower Extremity Assessment Lower Extremity Assessment: Generalized weakness;RLE deficits/detail RLE Deficits / Details: Incisional pain RLE Sensation: WNL    Cervical / Trunk Assessment Cervical / Trunk Assessment: Normal  Communication   Communication: No difficulties  Cognition Arousal/Alertness: Awake/alert Behavior During Therapy: WFL for tasks  assessed/performed Overall Cognitive Status: Within Functional Limits for tasks assessed                                        General Comments      Exercises Total Joint Exercises Ankle Circles/Pumps: AROM;Both;10 reps;Supine Quad Sets: AROM;Right;5 reps;Supine Other Exercises Other Exercises: Educated on HEP exercises to perform in bed prior to next PT visit.   Assessment/Plan    PT Assessment Patient needs continued PT services  PT Problem List Decreased strength;Decreased mobility;Decreased range of motion;Obesity;Decreased activity tolerance;Decreased balance;Pain       PT Treatment Interventions DME instruction;Therapeutic exercise;Gait training;Balance training;Stair training;Neuromuscular re-education;Functional mobility training;Therapeutic activities;Patient/family education    PT Goals (Current goals can be found in the Care Plan section)  Acute Rehab PT Goals Patient Stated Goal: go home, decrease R knee pain PT Goal Formulation: With patient Time For Goal Achievement: 04/22/21 Potential to Achieve Goals: Good    Frequency BID   Barriers to discharge        Co-evaluation               AM-PAC PT "6 Clicks" Mobility  Outcome Measure Help needed turning from your back to your side while in a flat bed without using bedrails?: A Little Help needed moving from lying on your back to sitting on the side of a flat bed without using bedrails?: A Little Help needed moving to and from a bed to a chair (including a wheelchair)?: A Little Help needed standing up from a chair using your arms (e.g., wheelchair or bedside chair)?: A Little Help needed to walk in hospital room?: A Little Help needed climbing 3-5 steps with a railing? : A Lot 6 Click Score: 17    End of Session Equipment Utilized During Treatment: Gait belt Activity Tolerance: Other (comment) (Limited by dizziness/nausea/vomiting) Patient left: in bed;with call bell/phone within  reach;with bed alarm set;with SCD's reapplied Nurse Communication: Mobility status PT Visit Diagnosis: Unsteadiness on feet (R26.81);Other abnormalities of gait and mobility (R26.89);Muscle weakness (generalized) (M62.81)    Time: [id number] PT Time Calculation (min) (ACUTE ONLY): 31 min   Charges:   PT Evaluation $PT Eval Low Complexity: 1 Low PT Treatments $Therapeutic Exercise: 8-22 mins $Therapeutic Activity: 8-22 mins       Keontae Levingston M. Fairly IV, PT, DPT Physical Therapist- [facility]  04/08/2021, 2:52 PM

## 2021-04-08 NOTE — Transfer of Care (Signed)
Immediate Anesthesia Transfer of Care Note  Patient: Aquira Staser  Procedure(s) Performed: TOTAL KNEE REVISION (Right: Knee)  Patient Location: PACU  Anesthesia Type:Spinal  Level of Consciousness: drowsy  Airway & Oxygen Therapy: Patient Spontanous Breathing and Patient connected to face mask oxygen  Post-op Assessment: Report given to RN  Post vital signs: stable  Last Vitals:  Vitals Value Taken Time  BP    Temp    Pulse 92 04/08/21 1012  Resp 14 04/08/21 1012  SpO2 98 % 04/08/21 1012  Vitals shown include unvalidated device data.  Last Pain:  Vitals:   04/08/21 0620  TempSrc: Temporal  PainSc: 2          Complications: No notable events documented.

## 2021-04-08 NOTE — H&P (Signed)
Chief Complaint  Patient presents with   Right Knee - Pain    History of the Present Illness: Kelly Pena is a 80 y.o. female here today.   The patient presents for follow-up evaluation status post revision right total knee arthroplasty. She had increasing pain, and a SPECT scan shows her polyethylene has broken loose from the tibial baseplate.  The patient states her health is generally good. She states she had some labs a couple of weeks ago, and they were normal. Her A1c is normal. 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 She is not on a blood thinner. She states she has very thin skin, and she has a puppy that scratches her leg all the time.   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   I have reviewed past medical, surgical, social and family history, and allergies as documented in the EMR.  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 Rudene Christians   Tumor removal ankle Left   Past Family History: Family History  Problem Relation Age of Onset   Breast cancer Mother   Hyperlipidemia (Elevated cholesterol) Father   High blood pressure (Hypertension) Father   Myocardial Infarction (Heart attack) Father   Skin cancer Father   Prostate cancer  Brother   High blood pressure (Hypertension) Brother   Hyperlipidemia (Elevated cholesterol) Brother   Stroke Maternal Grandmother   Cancer Maternal Grandfather   Myocardial Infarction (Heart attack) Paternal Grandmother   Medications: Current Outpatient Medications Ordered in Epic  Medication Sig Dispense Refill   albuterol 90 mcg/actuation inhaler Inhale 2 inhalations into the lungs every 6 (six) hours as needed for Wheezing   amLODIPine-benazepril (LOTREL) 5-40 mg capsule TAKE 1 CAPSULE BY MOUTH EVERY DAY 90 capsule 1   levothyroxine (SYNTHROID) 25 MCG tablet TAKE 1 TABLET (25 MCG TOTAL) BY MOUTH ONCE DAILY TAKE ON AN EMPTY STOMACH WITH A GLASS OF WATER AT LEAST 30-60 MINUTES BEFORE BREAKFAST. 90 tablet 1   metoprolol succinate (TOPROL-XL) 100 MG XL tablet TAKE 1 TABLET BY MOUTH EVERY DAY 90 tablet 3   montelukast (SINGULAIR) 10 mg tablet TAKE 1 TABLET BY MOUTH EVERY DAY AT NIGHT 90 tablet 3   multivitamin tablet Take 1 tablet by mouth once daily   omeprazole (PRILOSEC) 20 MG DR capsule TAKE 1 CAPSULE BY MOUTH EVERY DAY 90 capsule 3   traMADoL (ULTRAM) 50 mg tablet Take 1 tablet (50 mg total) by mouth every 6 (six) hours as needed for Pain 20 tablet 3   WIXELA INHUB 250-50 mcg/dose diskus inhaler INHALE 1 INHALATION INTO THE LUNGS EVERY 12 HOURS 180 each 3   No current Epic-ordered facility-administered medications on file.   Allergies: Allergies  Allergen Reactions   Sulfa (Sulfonamide Antibiotics) Shortness Of Breath  difficulty breathing  Sulfur Shortness Of Breath   Magnesium Salicylate-Caffeine Unknown   Hydrochlorothiazide Rash    Body mass index is 37.38 kg/m.  Review of Systems: A comprehensive 14 point ROS was performed, reviewed, and the pertinent orthopaedic findings are documented in the HPI.  Vitals:  04/02/21 1202  BP: (!) 132/92    General Physical Examination:   General/Constitutional: No apparent distress: well-nourished and well developed. Eyes:  Pupils equal, round with synchronous movement. Lungs: Clear to auscultation HEENT: Normal Vascular: No edema, swelling or tenderness, except as noted in detailed exam. Cardiac: Heart rate and rhythm is regular. Integumentary: No impressive skin lesions present, except as noted in detailed exam. Neuro/Psych: Normal mood and affect, oriented to person, place and time.  Musculoskeletal Examination:  On exam, right knee edema and bruising. Good pulses to the right leg.  Radiographs:  No new imaging studies were obtained today.  Assessment: ICD-10-CM  1. Failure of total knee arthroplasty, initial encounter (CMS-HCC) T84.018A  Z96.659  2. Severe obesity (BMI 35.0-39.9) with comorbidity (CMS-HCC) E66.01   Plan:  The patient has clinical findings of stable right total knee status post revision.  We discussed the patient's SPECT scan findings. She has increasing pain and her polyethylene has broken loose from the tibial baseplate. 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 I recommend revision right total knee arthroplasty. I explained the surgery and postoperative course in detail.  We will schedule the patient for right total knee arthroplasty in the near future.  Surgical Risks:  The nature of the condition and the proposed procedure has been reviewed in detail with the patient. Surgical versus non-surgical options and prognosis for recovery have been reviewed and the inherent risks and benefits of each have been discussed including the risks of infection, bleeding, injury to nerves/blood vessels/tendons, incomplete relief of symptoms, persisting pain and/or stiffness, loss of function, complex regional pain syndrome, failure of the procedure, as appropriate.  Teeth: Normal  Attestation: I, Dawn Royse, am documenting for [facility], MD utilizing [place].    Electronically signed by Lauris Poag, MD at 04/04/2021 4:40 PM EDT  Reviewed  H+P. No  changes noted.

## 2021-04-08 NOTE — TOC Initial Note (Signed)
Transition of Care [facility]) - Initial/Assessment Note    Patient Details  Name: Kelly Pena MRN: [id number] Date of Birth: [birth year]  [facility]) CM/SW Contact:    Su Hilt, RN Phone Number: 04/08/2021, 2:11 PM  Clinical Narrative:                 Met with the patient in the room at the bedside, She lives at [facility].  She plans to go home with [facility] services, She has a rollator and a rolling walker at home and would like to get a 3 in 1, Rhonda with [employer] will bring into the room prior to DC She has transportation and can afford her medications, PT to eval.         Patient Goals and CMS Choice        Expected Discharge Plan and Services                                                Prior Living Arrangements/Services                       Activities of Daily Living      Permission Sought/Granted                  Emotional Assessment              Admission diagnosis:  S/P revision of total knee, right [Z96.651] Patient Active Problem List   Diagnosis Date Noted   S/P revision of total knee, right 08/27/2019   PCP:  Derinda Late, MD Pharmacy:   [id number] [id number]: [phone]: [phone]    Social Determinants of Health (SDOH) Interventions    Readmission Risk Interventions No flowsheet data found.

## 2021-04-08 NOTE — Anesthesia Procedure Notes (Signed)
Spinal  Patient location during procedure: OR Start time: 04/08/2021 7:21 AM Reason for block: surgical anesthesia Staffing Performed: resident/CRNA  Preanesthetic Checklist Completed: patient identified, IV checked, site marked, risks and benefits discussed, surgical consent, monitors and equipment checked, pre-op evaluation and timeout performed Spinal Block Patient position: sitting Prep: DuraPrep Patient monitoring: heart rate, cardiac monitor, continuous pulse ox and blood pressure Approach: midline Location: L3-4 Injection technique: single-shot Needle Needle type: Sprotte  Needle gauge: 24 G Needle length: 9 cm Assessment Sensory level: T4 Events: CSF return Additional Notes Negative heme, negative paresthesia, no pain with injection, good free flow CSF pre and post injection.

## 2021-04-09 ENCOUNTER — Encounter: Payer: Self-pay | Admitting: Orthopedic Surgery

## 2021-04-09 LAB — CBC
HCT: 26.9 % — ABNORMAL LOW (ref 36.0–46.0)
Hemoglobin: 9.1 g/dL — ABNORMAL LOW (ref 12.0–15.0)
MCH: 30 pg (ref 26.0–34.0)
MCHC: 33.8 g/dL (ref 30.0–36.0)
MCV: 88.8 fL (ref 80.0–100.0)
Platelets: 145 10*3/uL — ABNORMAL LOW (ref 150–400)
RBC: 3.03 MIL/uL — ABNORMAL LOW (ref 3.87–5.11)
RDW: 14.2 % (ref 11.5–15.5)
WBC: 6.3 10*3/uL (ref 4.0–10.5)
nRBC: 0 % (ref 0.0–0.2)

## 2021-04-09 LAB — BASIC METABOLIC PANEL
Anion gap: 5 (ref 5–15)
BUN: 22 mg/dL (ref 8–23)
CO2: 24 mmol/L (ref 22–32)
Calcium: 8.6 mg/dL — ABNORMAL LOW (ref 8.9–10.3)
Chloride: 106 mmol/L (ref 98–111)
Creatinine, Ser: 1.16 mg/dL — ABNORMAL HIGH (ref 0.44–1.00)
GFR, Estimated: 48 mL/min — ABNORMAL LOW (ref >60)
Glucose, Bld: 118 mg/dL — ABNORMAL HIGH (ref 70–99)
Potassium: 3.9 mmol/L (ref 3.5–5.1)
Sodium: 135 mmol/L (ref 135–145)

## 2021-04-09 NOTE — Anesthesia Postprocedure Evaluation (Signed)
Anesthesia Post Note  Patient: Kelly Pena  Procedure(s) Performed: TOTAL KNEE REVISION (Right: Knee)  Patient location during evaluation: Nursing Unit Anesthesia Type: Spinal Level of consciousness: oriented and awake and alert Pain management: pain level controlled Vital Signs Assessment: post-procedure vital signs reviewed and stable Respiratory status: spontaneous breathing and respiratory function stable Cardiovascular status: blood pressure returned to baseline and stable Postop Assessment: no headache, no backache, no apparent nausea or vomiting and patient able to bend at knees Anesthetic complications: no   No notable events documented.   Last Vitals:  Vitals:   04/08/21 2352 04/09/21 0426  BP: 140/81 121/68  Pulse: 70 81  Resp: 20 20  Temp: 36.9 C 37.2 C  SpO2: 100% 96%    Last Pain:  Vitals:   04/08/21 2352  TempSrc: Oral  PainSc:                  [name]

## 2021-04-09 NOTE — Plan of Care (Signed)

## 2021-04-09 NOTE — Progress Notes (Signed)
Physical Therapy Treatment Patient Details Name: Kelly Pena MRN: [id number] DOB: 02/08/1941 Today's Date: 04/09/2021    History of Present Illness Pt admitted for second R TKA revision on 8/4. Pt admitted on 12/22 for previous R knee revision. PMH includes asthma, HTN, GERD, and renal disease.    PT Comments    Pt was long sitting in bed upon arriving. She is A and O x 4 however author questions overall cognition throughout session. Does have poor safety awareness/insight of deficits. Needs constant vcs for technique and safety improvements. Pt was able to exit bed and stand to RW without assistance however after ambulating ~ 30 ft has severe SOB. Gasping for air. RN notified. Per pt, I have asthma and get very anxious at times. Session mostly impacted by attack. Will return for PM session when spouse present to address concerns with DC. Currently recommending DC home with HHPT however may be rehab appropriate.   Follow Up Recommendations  Home health PT;Supervision for mobility/OOB;Supervision/Assistance - 24 hour     Equipment Recommendations  Rolling walker with 5" wheels;3in1 (PT)       Precautions / Restrictions Precautions Precautions: Fall Restrictions Weight Bearing Restrictions: Yes RLE Weight Bearing: Weight bearing as tolerated    Mobility  Bed Mobility Overal bed mobility: Needs Assistance Bed Mobility: Supine to Sit;Sit to Supine     Supine to sit: Supervision;HOB elevated Sit to supine: Supervision;HOB elevated   General bed mobility comments: no physical assistance required to exit or re-enter bed    Transfers Overall transfer level: Needs assistance Equipment used: Rolling walker (2 wheeled) Transfers: Sit to/from Stand Sit to Stand: Supervision         General transfer comment: pt was able to stand without physical assistance however needs vcs for technique improvements  Ambulation/Gait Ambulation/Gait assistance: Min assist Gait Distance (Feet):  30 Feet Assistive device: Rolling walker (2 wheeled) Gait Pattern/deviations: Step-to pattern;Antalgic Gait velocity: decrease   General Gait Details: Pt was able to ambulate to doorway of room and return without difficulty however once she started to return towards bed, required min assist due to pt have SOB/anxiety/ asthma attack.    Balance Overall balance assessment: Needs assistance Sitting-balance support: No upper extremity supported;Feet supported Sitting balance-Leahy Scale: Good     Standing balance support: Bilateral upper extremity supported;During functional activity Standing balance-Leahy Scale: Fair      Cognition Arousal/Alertness: Awake/alert Behavior During Therapy: WFL for tasks assessed/performed Overall Cognitive Status: Within Functional Limits for tasks assessed      General Comments: Pt is A and oriented x 3 but does have some cognition concerns come to light during session      Exercises Other Exercises Other Exercises: Pt instructed in polar care mgt, compression stocking mgt, falls prevention, AE/DME, home/routines modifications; handout provided        Pertinent Vitals/Pain Pain Assessment: 0-10 Pain Score: 6  Pain Location: knee Pain Descriptors / Indicators: Aching;Guarding Pain Intervention(s): Limited activity within patient's tolerance;Monitored during session;Premedicated before session;Repositioned;Ice applied    Home Living Family/patient expects to be discharged to:: Private residence Living Arrangements: Spouse/significant other (2 dogs) Available Help at Discharge: Family Type of Home: Independent living facility Home Access: Level entry   Home Layout: One level Home Equipment: Environmental consultant - 2 wheels;Walker - 4 wheels;Cane - single point;Shower seat - built in;Grab bars - tub/shower      Prior Function Level of Independence: Independent with assistive device(s)      Comments: Indep prior to admission with rollator.  PT Goals  (current goals can now be found in the care plan section) Acute Rehab PT Goals Patient Stated Goal: go home, decrease R knee pain Progress towards PT goals: Progressing toward goals    Frequency    BID      PT Plan Current plan remains appropriate       AM-PAC PT "6 Clicks" Mobility   Outcome Measure  Help needed turning from your back to your side while in a flat bed without using bedrails?: A Little Help needed moving from lying on your back to sitting on the side of a flat bed without using bedrails?: A Little Help needed moving to and from a bed to a chair (including a wheelchair)?: A Little Help needed standing up from a chair using your arms (e.g., wheelchair or bedside chair)?: A Little Help needed to walk in hospital room?: A Little Help needed climbing 3-5 steps with a railing? : A Lot 6 Click Score: 17    End of Session Equipment Utilized During Treatment: Gait belt Activity Tolerance: Other (comment) (did have sever SOB/anxiety attack during session) Patient left: in bed;with call bell/phone within reach;with bed alarm set;with SCD's reapplied Nurse Communication: Mobility status PT Visit Diagnosis: Unsteadiness on feet (R26.81);Other abnormalities of gait and mobility (R26.89);Muscle weakness (generalized) (M62.81)     Time: [id number] PT Time Calculation (min) (ACUTE ONLY): 25 min  Charges:  $Gait Training: 8-22 mins $Therapeutic Activity: 8-22 mins                     Julaine Fusi PTA 04/09/21, 2:17 PM

## 2021-04-09 NOTE — Evaluation (Signed)
Occupational Therapy Evaluation Patient Details Name: Kelly Pena MRN: [id number] DOB: 04-06-1941 Today's Date: 04/09/2021    History of Present Illness Pt admitted for second R TKA revision on 8/4. Pt admitted on 12/22 for previous R knee revision. PMH includes asthma, HTN, GERD, and renal disease.   Clinical Impression   Pt seen for OT evaluation this date, POD#1 from above surgery. Pt was independent in all ADL prior to surgery, however occasionally using rollator for ADL mobility due to R knee pain. Pt is eager to return to PLOF with less pain and improved safety and independence. Pt currently requires MIN-MOD assist for LB dressing and bathing while in seated position due to pain and limited AROM of R knee. Anticipate CGA + RW for ADL transfers. Pt declined OOB/EOB participation at time of evaluation 2/2 R knee pain and recently had "asthmatic attack" while working with PT. Pt instructed in polar care mgt, falls prevention strategies, home/routines modifications, DME/AE for LB bathing and dressing tasks, and compression stocking mgt. Handout provided to support recall and carryover. Pt verbalized understanding. Pt would benefit from skilled OT services including additional instruction in dressing techniques with or without assistive devices for dressing and bathing skills to support recall and carryover prior to discharge and ultimately to maximize safety, independence, and minimize falls risk and caregiver burden. Currently anticipate HHOT + Supervision for OOB and ADL following this hospitalization, pending additional progress towards goals.     Follow Up Recommendations  Home health OT;Other (comment) (supervision for OOB/ADL)    Equipment Recommendations  3 in 1 bedside commode    Recommendations for Other Services       Precautions / Restrictions Precautions Precautions: Fall Restrictions Weight Bearing Restrictions: Yes RLE Weight Bearing: Weight bearing as tolerated       Mobility Bed Mobility               General bed mobility comments: Pt declined 2/2 R knee pain and recently up with "asthmatic attack"    Transfers                      Balance                                           ADL either performed or assessed with clinical judgement   ADL Overall ADL's : Needs assistance/impaired                                       General ADL Comments: Pt currently requires MIN-MOD A for LB ADL tasks 2/2 decreased RLE strength, ROM, pain, balance, and activity tolerance. Anticipate CGA +  RW for ADL transfers based on PT report     Vision         Perception     Praxis      Pertinent Vitals/Pain Pain Assessment: 0-10 Pain Score: 2  Pain Location: R knee at rest slight bend in knee 2/2 hip ER, worse with passive extension with RLE repositioned for optimal alignment Pain Descriptors / Indicators: Aching;Guarding Pain Intervention(s): Limited activity within patient's tolerance;Monitored during session;Premedicated before session;Repositioned;Ice applied     Hand Dominance     Extremity/Trunk Assessment Upper Extremity Assessment Upper Extremity Assessment: Overall WFL for tasks assessed   Lower Extremity Assessment Lower Extremity  Assessment: Generalized weakness;RLE deficits/detail RLE: Unable to fully assess due to pain RLE Sensation: WNL       Communication Communication Communication: No difficulties   Cognition Arousal/Alertness: Awake/alert Behavior During Therapy: WFL for tasks assessed/performed Overall Cognitive Status: Within Functional Limits for tasks assessed                                 General Comments: a bit sleepy   General Comments       Exercises Other Exercises Other Exercises: Pt instructed in polar care mgt, compression stocking mgt, falls prevention, AE/DME, home/routines modifications; handout provided   Shoulder Instructions       Home Living Family/patient expects to be discharged to:: Private residence Living Arrangements: Spouse/significant other (2 dogs) Available Help at Discharge: Family Type of Home: Independent living facility Home Access: Level entry     Home Layout: One level     Bathroom Shower/Tub: Occupational psychologist: Handicapped height Bathroom Accessibility: Yes   Home Equipment: Environmental consultant - 2 wheels;Walker - 4 wheels;Cane - single point;Shower seat - built in;Grab bars - tub/shower          Prior Functioning/Environment Level of Independence: Independent with assistive device(s)        Comments: Indep prior to admission with rollator.        OT Problem List: Decreased strength;Pain;Decreased range of motion;Decreased activity tolerance;Decreased knowledge of use of DME or AE      OT Treatment/Interventions: Self-care/ADL training;Therapeutic exercise;Therapeutic activities;DME and/or AE instruction;Patient/family education;Balance training    OT Goals(Current goals can be found in the care plan section) Acute Rehab OT Goals Patient Stated Goal: go home, decrease R knee pain OT Goal Formulation: With patient Time For Goal Achievement: 04/23/21 Potential to Achieve Goals: Good ADL Goals Pt Will Perform Lower Body Dressing: sit to/from stand;with caregiver independent in assisting Pt Will Transfer to Toilet: with supervision;ambulating (LRAD PRN) Additional ADL Goal #1: Pt will independently instruct family/caregiver in polar care mgt Additional ADL Goal #2: Pt will independently instruct family/caregiver in compression stocking mgt  OT Frequency: Min 2X/week   Barriers to D/C:            Co-evaluation              AM-PAC OT "6 Clicks" Daily Activity     Outcome Measure Help from another person eating meals?: None Help from another person taking care of personal grooming?: A Little Help from another person toileting, which includes using toliet,  bedpan, or urinal?: A Little Help from another person bathing (including washing, rinsing, drying)?: A Little Help from another person to put on and taking off regular upper body clothing?: None Help from another person to put on and taking off regular lower body clothing?: A Little 6 Click Score: 20   End of Session    Activity Tolerance: Patient limited by pain Patient left: in bed;with call bell/phone within reach;with bed alarm set;with SCD's reapplied;Other (comment) (polar care in place, rolled towel under R ankle, pillow and blanket placed between knee and bed rail to support neutral RLE positioning and optimal passive knee extension.)  OT Visit Diagnosis: Other abnormalities of gait and mobility (R26.89);Muscle weakness (generalized) (M62.81);Pain Pain - Right/Left: Right Pain - part of body: Knee                Time: 0950-1011 OT Time Calculation (min): 21 min Charges:  OT General Charges $  OT Visit: 1 Visit OT Evaluation $OT Eval Moderate Complexity: 1 Mod OT Treatments $Self Care/Home Management : 8-22 mins  Hanley Hays, MPH, MS, OTR/L ascom [phone] 04/09/21, 1:25 PM

## 2021-04-09 NOTE — Progress Notes (Signed)
   Subjective: 1 Day Post-Op Procedure(s) (LRB): TOTAL KNEE REVISION (Right) Patient reports pain as moderate.   Patient is well, and has had no acute complaints or problems Denies any CP, SOB, ABD pain. We will continue therapy today.  Plan is to go Home after hospital stay.  Objective: Vital signs in last 24 hours: Temp:  [97.7 F (36.5 C)-99.1 F (37.3 C)] 99.1 F (37.3 C) (08/05 0803) Pulse Rate:  [51-86] 72 (08/05 0803) Resp:  [10-20] 18 (08/05 0803) BP: (95-140)/(54-81) 130/62 (08/05 0803) SpO2:  [96 %-100 %] 96 % (08/05 0803)  Intake/Output from previous day: 08/04 0701 - 08/05 0700 In: 1273.7 [P.O.:120; I.V.:1153.7] Out: 1400 [Urine:1250; Blood:150] Intake/Output this shift: No intake/output data recorded.  Recent Labs    04/06/21 1451 04/08/21 1212 04/09/21 0547  HGB 11.9* 9.8* 9.1*   Recent Labs    04/08/21 1212 04/09/21 0547  WBC 10.6* 6.3  RBC 3.28* 3.03*  HCT 29.7* 26.9*  PLT 176 145*   Recent Labs    04/06/21 1451 04/08/21 1212 04/09/21 0547  NA 138  --  135  K 4.1  --  3.9  CL 108  --  106  CO2 23  --  24  BUN 28*  --  22  CREATININE 1.36* 1.33* 1.16*  GLUCOSE 103*  --  118*  CALCIUM 9.4  --  8.6*   No results for input(s): LABPT, INR in the last 72 hours.  EXAM General - Patient is Alert, Appropriate, and Oriented Extremity - Neurovascular intact Sensation intact distally Intact pulses distally Dorsiflexion/Plantar flexion intact No cellulitis present Compartment soft Dressing - dressing C/D/I and no drainage, Prevena intact without drainage Motor Function - intact, moving foot and toes well on exam.   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 *  Estimated body mass index is 37.76 kg/m as calculated from the following:   Height as of this encounter: '5\' 4"'$  (1.626 m).   Weight as of this encounter: 99.8 kg. Advance diet Up with therapy Work on bowel movement Labs and vital signs are stable Pain controlled Care manager to assist with discharge to home with home health PT pending progress with PT.  DVT Prophylaxis - Lovenox, Foot Pumps, and TED hose Weight-Bearing as tolerated to right leg   T. Rachelle Hora, PA-C [place] 04/09/2021, 8:08 AM

## 2021-04-10 MED ORDER — ENOXAPARIN SODIUM 40 MG/0.4ML IJ SOSY: 40.0000 mg | PREFILLED_SYRINGE | INTRAMUSCULAR | 0 refills | Status: DC

## 2021-04-10 MED ORDER — HYDROCODONE-ACETAMINOPHEN 5-325 MG PO TABS: 1.0000 | ORAL_TABLET | ORAL | 0 refills | Status: DC | PRN

## 2021-04-10 MED ORDER — MAGNESIUM HYDROXIDE 400 MG/5ML PO SUSP
30.0000 mL | Freq: Once | ORAL | Status: AC
  Administered 2021-04-10: 30 mL via ORAL
  Filled 2021-04-10: qty 30

## 2021-04-10 MED ORDER — METHOCARBAMOL 500 MG PO TABS: 500.0000 mg | ORAL_TABLET | Freq: Four times a day (QID) | ORAL | 0 refills | Status: DC | PRN

## 2021-04-10 MED ORDER — DOCUSATE SODIUM 100 MG PO CAPS: 100.0000 mg | ORAL_CAPSULE | Freq: Two times a day (BID) | ORAL | 0 refills | Status: DC

## 2021-04-10 NOTE — TOC Transition Note (Signed)
Transition of Care [facility]) - CM/SW Discharge Note   Patient Details  Name: Kelly Pena MRN: [id number] Date of Birth: 1940-11-08  [facility] of [facility]) CM/SW Contact:  Magnus Ivan, LCSW Phone Number: 04/10/2021, 11:10 AM   Clinical Narrative:   Patient to discharge home today. Notified [place] with [facility].     Final next level of care: [place] Barriers to Discharge: Barriers Resolved   Patient Goals and CMS Choice Patient states their goals for this hospitalization and ongoing recovery are:: go home      Discharge Placement                       Discharge Plan and Services   Discharge Planning Services: CM Consult            DME Arranged: 3-N-1 DME Agency: [employer] Date DME Agency Contacted: 04/08/21 Time DME Agency Contacted: [phone] Representative spoke with at DME Agency: [place]: PT, OT [place] Agency: [place] Date [place]: 04/10/21 Time [place]: 1414 Representative spoke with at [place]: [place]  Social Determinants of Health ([place]) Interventions     Readmission Risk Interventions No flowsheet data found.

## 2021-04-10 NOTE — Progress Notes (Signed)
   Subjective: 2 Days Post-Op Procedure(s) (LRB): TOTAL KNEE REVISION (Right) Patient reports pain as mild.   Patient is well, and has had no acute complaints or problems Denies any CP, SOB, ABD pain. We will continue therapy today.  Plan is to go Home after hospital stay.  Objective: Vital signs in last 24 hours: Temp:  [98 F (36.7 C)-99.1 F (37.3 C)] 99 F (37.2 C) (08/06 0459) Pulse Rate:  [63-72] 63 (08/06 0459) Resp:  [16-18] 17 (08/06 0459) BP: (120-140)/(62-84) 121/69 (08/06 0459) SpO2:  [95 %-100 %] 100 % (08/06 0459)  Intake/Output from previous day: 08/05 0701 - 08/06 0700 In: 240 [P.O.:240] Out: -  Intake/Output this shift: No intake/output data recorded.  Recent Labs    04/08/21 1212 04/09/21 0547  HGB 9.8* 9.1*   Recent Labs    04/08/21 1212 04/09/21 0547  WBC 10.6* 6.3  RBC 3.28* 3.03*  HCT 29.7* 26.9*  PLT 176 145*   Recent Labs    04/08/21 1212 04/09/21 0547  NA  --  135  K  --  3.9  CL  --  106  CO2  --  24  BUN  --  22  CREATININE 1.33* 1.16*  GLUCOSE  --  118*  CALCIUM  --  8.6*   No results for input(s): LABPT, INR in the last 72 hours.  EXAM General - Patient is Alert, Appropriate, and Oriented Extremity - Neurovascular intact Sensation intact distally Intact pulses distally Dorsiflexion/Plantar flexion intact No cellulitis present Compartment soft Dressing - dressing C/D/I and no drainage, Prevena intact without drainage Motor Function - intact, moving foot and toes well on exam.   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 *  Estimated body mass index is 37.76 kg/m as calculated from the following:   Height as of this encounter: '5\' 4"'$  (1.626 m).   Weight as of this encounter: 99.8 kg. Advance diet Up with therapy Work on bowel movement Vital signs are stable Pain controlled Care manager to assist with discharge to home with home health PT pending progress with PT.  DVT Prophylaxis - Lovenox, Foot Pumps, and TED hose Weight-Bearing as tolerated to right leg   T. Rachelle Hora, PA-C [place] 04/10/2021, 7:56 AM

## 2021-04-10 NOTE — Discharge Summary (Signed)
Physician Discharge Summary  Patient ID: Kelly Pena MRN: [id number] DOB/AGE: May 11, 1941 80 y.o.  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 female who was admitted 04/08/2021 with a diagnosis of failed right total knee and went to the operating room on 04/08/2021 and underwent the above named procedures.    Surgeries: Procedure(s): TOTAL KNEE REVISION on 04/08/2021 Patient tolerated the surgery well. Taken to PACU where she was stabilized and then transferred to the orthopedic floor.  Started on Lovenox 30 mg q 12 hrs. Foot pumps applied bilaterally at 80 mm. Heels elevated on bed with rolled towels. No evidence of DVT. Negative Homan. Physical therapy started on day #1 for gait training and transfer. OT started day #1 for ADL and assisted devices.  Patient's foley was d/c on day #1. Patient's IV  was d/c on day #2.  On post op day #2 patient was stable and ready for discharge to home with HHPT.  Implants: Medacta GM K revision system 3 tibia with 5 mm augments medial and lateral 12 x 65 mm stem for femur with 14 mm 65 x 36m intramedullary stem 4 mm augments  medial distal and lateral distal to patella and 17 mm size 3 semiconstrained insert size 2 patella all components cemented    She was given perioperative antibiotics:  Anti-infectives (From admission, onward)    Start     Dose/Rate Route Frequency Ordered Stop   04/08/21 1500  ceFAZolin (ANCEF) IVPB 2g/100 mL premix        2 g 200 mL/hr over 30 Minutes Intravenous Every 8 hours 04/08/21 1101 04/08/21 2240   04/08/21 0628  ceFAZolin (ANCEF) 2-4 GM/100ML-% IVPB       Note to Pharmacy: FOlena Mater  : cabinet override      04/08/21 0628 04/08/21 0739   04/08/21 0600  ceFAZolin (ANCEF) IVPB 2g/100 mL premix        2 g 200 mL/hr over 30 Minutes Intravenous On call to O.R. 04/08/21 [id number]/04/22 0723     .  She was given sequential compression devices, early ambulation, and Lovenox, teds for DVT prophylaxis.  She benefited maximally from the hospital stay and there were no complications.    Recent vital signs:  Vitals:   04/10/21 0459 04/10/21 0811  BP: 121/69 132/63  Pulse: 63 68  Resp: 17 18  Temp: 99 F (37.2 C) 98.4 F (36.9 C)  SpO2: 100% 99%    Recent laboratory studies:  Lab Results  Component Value Date  HGB 9.1 (L) 04/09/2021   HGB 9.8 (L) 04/08/2021   HGB 11.9 (L) 04/06/2021   Lab Results  Component Value Date   WBC 6.3 04/09/2021   PLT 145 (L) 04/09/2021   No results found for: INR Lab Results  Component Value Date   NA 135 04/09/2021   K 3.9 04/09/2021   CL 106 04/09/2021   CO2 24 04/09/2021   BUN 22 04/09/2021   CREATININE 1.16 (H) 04/09/2021   GLUCOSE 118 (H) 04/09/2021    Discharge Medications:   Allergies as of 04/10/2021       Reactions   Elemental Sulfur Shortness Of Breath   Cantaloupe (diagnostic) Diarrhea   Other    CATS-EXACERBATES ASTHMA   Tramadol Hcl Nausea Only   Hydrochlorothiazide Rash        Medication List     TAKE these medications    acetaminophen 500 MG tablet Commonly known as: TYLENOL Take 500 mg by mouth  every 6 (six) hours as needed for moderate pain.   albuterol 108 (90 Base) MCG/ACT inhaler Commonly known as: VENTOLIN HFA Inhale 1-2 puffs into the lungs every 6 (six) hours as needed for wheezing or shortness of breath.   amLODipine-benazepril 5-40 MG capsule Commonly known as: LOTREL Take 1 capsule by mouth every morning.   calcium carbonate 500 MG chewable tablet Commonly known as: TUMS - dosed in mg elemental calcium Chew 1 tablet by mouth daily as needed for indigestion or heartburn.   docusate sodium 100 MG capsule Commonly known as: COLACE Take 1 capsule (100 mg total) by mouth 2 (two) times daily.   enoxaparin 40 MG/0.4ML injection Commonly known as: LOVENOX Inject 0.4 mLs (40 mg total) into the skin daily for 14 days.   Fluticasone-Salmeterol 250-50 MCG/DOSE Aepb Commonly known as: ADVAIR Inhale 1 puff into the lungs 2 (two) times daily.   HYDROcodone-acetaminophen 5-325 MG tablet Commonly known as: NORCO/VICODIN Take 1-2 tablets by mouth every 4 (four) hours as needed for moderate pain (pain score 4-6).   levothyroxine 25 MCG tablet Commonly known as: SYNTHROID Take 25 mcg by mouth daily before breakfast.   methocarbamol 500 MG tablet Commonly known as: ROBAXIN Take 1 tablet (500 mg total) by mouth every 6 (six) hours as needed for muscle spasms.   metoprolol succinate 100 MG 24 hr tablet Commonly known as: TOPROL-XL Take 100 mg by mouth every morning. Take with or immediately following a meal.   montelukast 10 MG tablet Commonly known as: SINGULAIR Take 10 mg by mouth at bedtime.   omeprazole 20 MG capsule Commonly known as: PRILOSEC Take 20 mg by mouth every morning.               Durable Medical Equipment  (From admission, onward)           Start     Ordered   04/08/21 1102  DME Walker rolling  Once       Question Answer Comment  Walker: With 5 Inch Wheels   Patient needs a walker to treat with the following condition S/P revision of  total knee, right      04/08/21 1101   04/08/21 1102  DME 3 n 1  Once        04/08/21 1101   04/08/21 1102  DME Bedside commode  Once       Question:  Patient needs a bedside commode to treat with the following condition  Answer:  S/P revision of total knee, right  04/08/21 1101            Diagnostic Studies: DG Knee 1-2 Views Right  Result Date: 04/08/2021 CLINICAL DATA:  Patient status post revision of all components of a right knee arthroplasty today. EXAM: RIGHT KNEE - 1-2 VIEW COMPARISON:  Plain films right knee 08/27/2019. FINDINGS: Previously seen right knee arthroplasty has been removed with a new device in place. Surgical drain and stimulant beads are in place. No acute abnormality. IMPRESSION: Status post revision of right total knee arthroplasty. No acute finding. Electronically Signed   By: Inge Rise M.D.   On: 04/08/2021 11:05   NM Bone Scan 3 Phase  Result Date: 04/02/2021 CLINICAL DATA:  Status post bilateral total knee arthroplasty. Right knee pain. EXAM: NUCLEAR MEDICINE 3-PHASE BONE SCAN WITH SPECT-CT TECHNIQUE: Radionuclide angiographic images, immediate static blood pool images, and 3-hour delayed static images were obtained of the knees after intravenous injection of radiopharmaceutical. RADIOPHARMACEUTICALS:  23.17 mCi Tc-30mMDP IV COMPARISON:  None. FINDINGS: CT images: On the CT portion of the exam there is a moderate size right joint effusion. Additionally, there has been anterior displacement of the right knee arthroplasty device polyethylene liner by approximately 2 cm, image 284/4. Vascular phase: The flow phase images are count poor and nondiagnostic. Blood pool phase: Asymmetric increased soft uptake surrounds the femoral and tibial components of the right knee arthroplasty device. No significant uptake surrounding the left knee arthroplasty device. Delayed phase: There is moderate to intense uptake surrounding the femoral and tibial components of the right  knee arthroplasty device. On the delayed SPECT images more focal intense uptake localizes to the posterior aspect of the lateral right tibial plateau and patellofemoral compartment, image 146/1003 and image 103/1001. IMPRESSION: 1. Right knee suprapatellar joint effusion with anterior displacement of the right knee arthroplasty polyethylene liner. 2. Although the flow phase images are nondiagnostic there is abnormal asymmetric increased uptake surrounding the femoral and tibial components of the right knee arthroplasty device on both blood pool and delayed phase images. On the SPECT-CT images intense uptake localizes to the right knee patellofemoral compartment and posterior aspect of the right knee lateral tibial plateau. Electronically Signed   By: TKerby MoorsM.D.   On: 04/02/2021 08:47    Disposition: Discharge disposition: 06-Home-Health Care Svc         Follow-up Information     GDuanne Guess PA-C Follow up in 2 week(s).   Specialties: Orthopedic Surgery, Emergency Medicine Contact information: [phone]                 Signed: GFeliberto Gottron8/02/2021, 9:26 AM

## 2021-04-10 NOTE — Progress Notes (Signed)
Patient discharging home, vss, wound vac changed to prevena. IV removed. Husband to transport patient home. Instructions given to patient, verbalized understanding.

## 2021-04-10 NOTE — Progress Notes (Signed)
Physical Therapy Treatment Patient Details Name: Kelly Pena MRN: [id number] DOB: 16-Jun-1941 Today's Date: 04/10/2021    History of Present Illness Pt admitted for second R TKA revision on 8/4. Pt admitted on 12/22 for previous R knee revision. PMH includes asthma, HTN, GERD, and renal disease.    PT Comments    Pt demonstrated safe abilities throughout session and is cleared from PT standpoint to DC home with HHPT to follow.    Follow Up Recommendations  Home health PT;Supervision for mobility/OOB;Supervision/Assistance - 24 hour     Equipment Recommendations  None recommended by PT (pt has recieved all neccessary equipment)    Recommendations for Other Services       Precautions / Restrictions Precautions Precautions: Fall Restrictions Weight Bearing Restrictions: Yes RLE Weight Bearing: Weight bearing as tolerated    Mobility  Bed Mobility Overal bed mobility: Needs Assistance Bed Mobility: Supine to Sit     Supine to sit: Supervision     General bed mobility comments: no physical assistance required to exit L side of bed    Transfers Overall transfer level: Needs assistance Equipment used: Rolling walker (2 wheeled) Transfers: Sit to/from Stand Sit to Stand: Supervision            Ambulation/Gait Ambulation/Gait assistance: Min guard Gait Distance (Feet): 120 Feet Assistive device: Rolling walker (2 wheeled) Gait Pattern/deviations: Step-through pattern Gait velocity: decrease   General Gait Details: pt was able to tolerate increased gait distances without SOB/ difficulty breathing       Balance Overall balance assessment: Modified Independent Sitting-balance support: No upper extremity supported;Feet supported       Standing balance support: Bilateral upper extremity supported;During functional activity Standing balance-Leahy Scale: Good         Cognition Arousal/Alertness: Awake/alert Behavior During Therapy: WFL for tasks  assessed/performed Overall Cognitive Status: Within Functional Limits for tasks assessed              General Comments: Pt is A and O x 4 and agreeable to PT session      Exercises Total Joint Exercises Ankle Circles/Pumps: AROM;Both;10 reps;Supine Quad Sets: AROM;10 reps;Supine Heel Slides: AROM;10 reps Hip ABduction/ADduction: AROM;10 reps Straight Leg Raises: AROM;10 reps        Pertinent Vitals/Pain Pain Assessment: 0-10 Pain Score: 7  Pain Location: knee Pain Descriptors / Indicators: Aching;Guarding Pain Intervention(s): Limited activity within patient's tolerance;Monitored during session;Premedicated before session;Repositioned;Ice applied     PT Goals (current goals can now be found in the care plan section) Acute Rehab PT Goals Patient Stated Goal: have less pain Progress towards PT goals: Progressing toward goals    Frequency    BID      PT Plan Current plan remains appropriate       AM-PAC PT "6 Clicks" Mobility   Outcome Measure  Help needed turning from your back to your side while in a flat bed without using bedrails?: None Help needed moving from lying on your back to sitting on the side of a flat bed without using bedrails?: None Help needed moving to and from a bed to a chair (including a wheelchair)?: A Little Help needed standing up from a chair using your arms (e.g., wheelchair or bedside chair)?: A Little Help needed to walk in hospital room?: A Little Help needed climbing 3-5 steps with a railing? : A Little 6 Click Score: 20    End of Session Equipment Utilized During Treatment: Gait belt Activity Tolerance: Patient tolerated treatment well Patient left: in  chair;with call bell/phone within reach;with chair alarm set Nurse Communication: Mobility status PT Visit Diagnosis: Unsteadiness on feet (R26.81);Other abnormalities of gait and mobility (R26.89);Muscle weakness (generalized) (M62.81)     Time: [id number] PT Time  Calculation (min) (ACUTE ONLY): 18 min  Charges:  $Gait Training: 8-22 mins                     Julaine Fusi PTA 04/10/21, 11:58 AM

## 2021-04-10 NOTE — Discharge Instructions (Signed)

## 2021-05-21 ENCOUNTER — Other Ambulatory Visit: Payer: Self-pay | Admitting: Orthopedic Surgery

## 2021-05-21 ENCOUNTER — Other Ambulatory Visit
Admission: RE | Admit: 2021-05-21 | Discharge: 2021-05-21 | Disposition: A | Discharge status: Home / Self Care | Payer: Medicare Other | Source: Ambulatory Visit | Attending: Orthopedic Surgery | Admitting: Orthopedic Surgery

## 2021-05-21 ENCOUNTER — Other Ambulatory Visit: Payer: Self-pay

## 2021-05-21 DIAGNOSIS — Z01812 Encounter for preprocedural laboratory examination: Secondary | ICD-10-CM | POA: Diagnosis present

## 2021-05-21 DIAGNOSIS — Z20822 Contact with and (suspected) exposure to covid-19: Secondary | ICD-10-CM | POA: Insufficient documentation

## 2021-05-22 LAB — SARS CORONAVIRUS 2 (TAT 6-24 HRS): SARS Coronavirus 2: NEGATIVE

## 2021-05-25 ENCOUNTER — Ambulatory Visit: Payer: Medicare Other | Admitting: Certified Registered Nurse Anesthetist

## 2021-05-25 ENCOUNTER — Encounter
Admission: RE | Disposition: A | Discharge status: Home / Self Care | Payer: Self-pay | Source: Home / Self Care | Attending: Orthopedic Surgery

## 2021-05-25 ENCOUNTER — Other Ambulatory Visit: Payer: Self-pay

## 2021-05-25 ENCOUNTER — Observation Stay
Admission: RE | Admit: 2021-05-25 | Discharge: 2021-05-26 | Disposition: A | Discharge status: Home / Self Care | Payer: Medicare Other | Attending: Orthopedic Surgery | Admitting: Orthopedic Surgery

## 2021-05-25 ENCOUNTER — Encounter: Payer: Self-pay | Admitting: Orthopedic Surgery

## 2021-05-25 DIAGNOSIS — Z96651 Presence of right artificial knee joint: Secondary | ICD-10-CM | POA: Diagnosis not present

## 2021-05-25 DIAGNOSIS — Y792 Prosthetic and other implants, materials and accessory orthopedic devices associated with adverse incidents: Secondary | ICD-10-CM | POA: Diagnosis not present

## 2021-05-25 DIAGNOSIS — X58XXXA Exposure to other specified factors, initial encounter: Secondary | ICD-10-CM | POA: Insufficient documentation

## 2021-05-25 DIAGNOSIS — E039 Hypothyroidism, unspecified: Secondary | ICD-10-CM | POA: Diagnosis not present

## 2021-05-25 DIAGNOSIS — S86811A Strain of other muscle(s) and tendon(s) at lower leg level, right leg, initial encounter: Principal | ICD-10-CM | POA: Diagnosis present

## 2021-05-25 DIAGNOSIS — I129 Hypertensive chronic kidney disease with stage 1 through stage 4 chronic kidney disease, or unspecified chronic kidney disease: Secondary | ICD-10-CM | POA: Diagnosis not present

## 2021-05-25 DIAGNOSIS — Z79899 Other long term (current) drug therapy: Secondary | ICD-10-CM | POA: Diagnosis not present

## 2021-05-25 DIAGNOSIS — J45909 Unspecified asthma, uncomplicated: Secondary | ICD-10-CM | POA: Insufficient documentation

## 2021-05-25 DIAGNOSIS — Z853 Personal history of malignant neoplasm of breast: Secondary | ICD-10-CM | POA: Insufficient documentation

## 2021-05-25 DIAGNOSIS — N189 Chronic kidney disease, unspecified: Secondary | ICD-10-CM | POA: Diagnosis not present

## 2021-05-25 DIAGNOSIS — T84012A Broken internal right knee prosthesis, initial encounter: Secondary | ICD-10-CM | POA: Diagnosis not present

## 2021-05-25 HISTORY — PX: PATELLAR TENDON REPAIR: SHX737

## 2021-05-25 SURGERY — REPAIR, TENDON, PATELLAR
Anesthesia: Spinal | Site: Knee | Laterality: Right

## 2021-05-25 MED ORDER — BENAZEPRIL HCL 20 MG PO TABS
40.0000 mg | ORAL_TABLET | Freq: Every day | ORAL | Status: DC
  Administered 2021-05-26: 40 mg via ORAL
  Filled 2021-05-25: qty 2

## 2021-05-25 MED ORDER — ALBUTEROL SULFATE (2.5 MG/3ML) 0.083% IN NEBU: 2.5000 mg | INHALATION_SOLUTION | Freq: Four times a day (QID) | RESPIRATORY_TRACT | Status: DC | PRN

## 2021-05-25 MED ORDER — SODIUM CHLORIDE 0.9 % IV SOLN: INTRAVENOUS | Status: DC

## 2021-05-25 MED ORDER — CEFAZOLIN SODIUM-DEXTROSE 2-4 GM/100ML-% IV SOLN
INTRAVENOUS | Status: AC
  Administered 2021-05-25: 2 g via INTRAVENOUS
  Filled 2021-05-25: qty 100

## 2021-05-25 MED ORDER — ACETAMINOPHEN 325 MG PO TABS: 325.0000 mg | ORAL_TABLET | Freq: Four times a day (QID) | ORAL | Status: DC | PRN

## 2021-05-25 MED ORDER — BUPIVACAINE HCL (PF) 0.5 % IJ SOLN
INTRAMUSCULAR | Status: AC
  Filled 2021-05-25: qty 30

## 2021-05-25 MED ORDER — PANTOPRAZOLE SODIUM 40 MG PO TBEC
80.0000 mg | DELAYED_RELEASE_TABLET | Freq: Every day | ORAL | Status: DC
  Administered 2021-05-26: 80 mg via ORAL
  Filled 2021-05-25: qty 2

## 2021-05-25 MED ORDER — ORAL CARE MOUTH RINSE: 15.0000 mL | Freq: Once | OROMUCOSAL | Status: AC

## 2021-05-25 MED ORDER — ONDANSETRON HCL 4 MG/2ML IJ SOLN: 4.0000 mg | Freq: Once | INTRAMUSCULAR | Status: DC | PRN

## 2021-05-25 MED ORDER — AMLODIPINE BESYLATE 5 MG PO TABS
5.0000 mg | ORAL_TABLET | Freq: Every day | ORAL | Status: DC
  Administered 2021-05-26: 5 mg via ORAL
  Filled 2021-05-25: qty 1

## 2021-05-25 MED ORDER — DEXMEDETOMIDINE (PRECEDEX) IN NS 20 MCG/5ML (4 MCG/ML) IV SYRINGE
PREFILLED_SYRINGE | INTRAVENOUS | Status: DC | PRN
  Administered 2021-05-25: 8 ug via INTRAVENOUS

## 2021-05-25 MED ORDER — ACETAMINOPHEN 10 MG/ML IV SOLN
INTRAVENOUS | Status: AC
  Administered 2021-05-25: 1000 mg via INTRAVENOUS
  Filled 2021-05-25: qty 100

## 2021-05-25 MED ORDER — NEOMYCIN-POLYMYXIN B GU 40-200000 IR SOLN
Status: AC
  Filled 2021-05-25: qty 2

## 2021-05-25 MED ORDER — ALBUTEROL SULFATE HFA 108 (90 BASE) MCG/ACT IN AERS: 1.0000 | INHALATION_SPRAY | Freq: Four times a day (QID) | RESPIRATORY_TRACT | Status: DC | PRN

## 2021-05-25 MED ORDER — LIDOCAINE HCL (PF) 2 % IJ SOLN
INTRAMUSCULAR | Status: AC
  Filled 2021-05-25: qty 5

## 2021-05-25 MED ORDER — ONDANSETRON HCL 4 MG/2ML IJ SOLN
INTRAMUSCULAR | Status: AC
  Filled 2021-05-25: qty 2

## 2021-05-25 MED ORDER — FENTANYL CITRATE (PF) 100 MCG/2ML IJ SOLN
INTRAMUSCULAR | Status: DC | PRN
  Administered 2021-05-25 (×2): 50 ug via INTRAVENOUS

## 2021-05-25 MED ORDER — ONDANSETRON HCL 4 MG PO TABS: 4.0000 mg | ORAL_TABLET | Freq: Four times a day (QID) | ORAL | Status: DC | PRN

## 2021-05-25 MED ORDER — MORPHINE SULFATE (PF) 2 MG/ML IV SOLN: 0.5000 mg | INTRAVENOUS | Status: DC | PRN

## 2021-05-25 MED ORDER — METHOCARBAMOL 1000 MG/10ML IJ SOLN
500.0000 mg | Freq: Four times a day (QID) | INTRAVENOUS | Status: DC | PRN
  Filled 2021-05-25: qty 5

## 2021-05-25 MED ORDER — PROPOFOL 500 MG/50ML IV EMUL
INTRAVENOUS | Status: DC | PRN
  Administered 2021-05-25: 80 ug/kg/min via INTRAVENOUS

## 2021-05-25 MED ORDER — CHLORHEXIDINE GLUCONATE 0.12 % MT SOLN: 15.0000 mL | Freq: Once | OROMUCOSAL | Status: AC

## 2021-05-25 MED ORDER — PROPOFOL 10 MG/ML IV BOLUS
INTRAVENOUS | Status: AC
  Filled 2021-05-25: qty 20

## 2021-05-25 MED ORDER — CALCIUM CARBONATE ANTACID 500 MG PO CHEW: 1.0000 | CHEWABLE_TABLET | Freq: Two times a day (BID) | ORAL | Status: DC | PRN

## 2021-05-25 MED ORDER — MORPHINE SULFATE (PF) 4 MG/ML IV SOLN
INTRAVENOUS | Status: AC
  Administered 2021-05-25: 4 mg
  Filled 2021-05-25: qty 1

## 2021-05-25 MED ORDER — HYDROCODONE-ACETAMINOPHEN 5-325 MG PO TABS
1.0000 | ORAL_TABLET | ORAL | Status: DC | PRN
  Administered 2021-05-25 – 2021-05-26 (×2): 1 via ORAL
  Filled 2021-05-25: qty 1

## 2021-05-25 MED ORDER — EPHEDRINE 5 MG/ML INJ
INTRAVENOUS | Status: AC
  Filled 2021-05-25: qty 5

## 2021-05-25 MED ORDER — PROPOFOL 10 MG/ML IV BOLUS
INTRAVENOUS | Status: DC | PRN
  Administered 2021-05-25: 30 mg via INTRAVENOUS

## 2021-05-25 MED ORDER — SODIUM CHLORIDE 0.9 % IR SOLN
Status: DC | PRN
  Administered 2021-05-25: 502 mL

## 2021-05-25 MED ORDER — ONDANSETRON HCL 4 MG/2ML IJ SOLN
INTRAMUSCULAR | Status: DC | PRN
  Administered 2021-05-25: 4 mg via INTRAVENOUS

## 2021-05-25 MED ORDER — ACETAMINOPHEN 10 MG/ML IV SOLN: 1000.0000 mg | Freq: Once | INTRAVENOUS | Status: DC | PRN

## 2021-05-25 MED ORDER — ONDANSETRON HCL 4 MG/2ML IJ SOLN: 4.0000 mg | Freq: Four times a day (QID) | INTRAMUSCULAR | Status: DC | PRN

## 2021-05-25 MED ORDER — HYDROCODONE-ACETAMINOPHEN 7.5-325 MG PO TABS: 1.0000 | ORAL_TABLET | ORAL | Status: DC | PRN

## 2021-05-25 MED ORDER — FENTANYL CITRATE (PF) 100 MCG/2ML IJ SOLN: 25.0000 ug | INTRAMUSCULAR | Status: DC | PRN

## 2021-05-25 MED ORDER — KETAMINE HCL 10 MG/ML IJ SOLN
INTRAMUSCULAR | Status: DC | PRN
  Administered 2021-05-25: 20 mg via INTRAVENOUS

## 2021-05-25 MED ORDER — LACTATED RINGERS IV SOLN: INTRAVENOUS | Status: DC

## 2021-05-25 MED ORDER — LEVOTHYROXINE SODIUM 25 MCG PO TABS
25.0000 ug | ORAL_TABLET | Freq: Every day | ORAL | Status: DC
  Administered 2021-05-26: 25 ug via ORAL
  Filled 2021-05-25: qty 1

## 2021-05-25 MED ORDER — CEFAZOLIN SODIUM-DEXTROSE 2-4 GM/100ML-% IV SOLN
2.0000 g | Freq: Four times a day (QID) | INTRAVENOUS | Status: AC
  Administered 2021-05-26: 2 g via INTRAVENOUS
  Filled 2021-05-25 (×2): qty 100

## 2021-05-25 MED ORDER — SODIUM CHLORIDE 0.9 % IV SOLN
INTRAVENOUS | Status: DC | PRN
  Administered 2021-05-25: 30 ug/min via INTRAVENOUS

## 2021-05-25 MED ORDER — AMLODIPINE BESYLATE 5 MG PO TABS: 5.0000 mg | ORAL_TABLET | Freq: Every day | ORAL | Status: DC

## 2021-05-25 MED ORDER — ENOXAPARIN SODIUM 60 MG/0.6ML IJ SOSY
0.5000 mg/kg | PREFILLED_SYRINGE | INTRAMUSCULAR | Status: DC
  Administered 2021-05-26: 50 mg via SUBCUTANEOUS
  Filled 2021-05-25: qty 0.6

## 2021-05-25 MED ORDER — METHOCARBAMOL 500 MG PO TABS: 500.0000 mg | ORAL_TABLET | Freq: Four times a day (QID) | ORAL | Status: DC | PRN

## 2021-05-25 MED ORDER — FENTANYL CITRATE (PF) 100 MCG/2ML IJ SOLN
INTRAMUSCULAR | Status: AC
  Filled 2021-05-25: qty 2

## 2021-05-25 MED ORDER — FLUTICASONE FUROATE-VILANTEROL 200-25 MCG/INH IN AEPB
1.0000 | INHALATION_SPRAY | Freq: Every day | RESPIRATORY_TRACT | Status: DC
  Filled 2021-05-25: qty 28

## 2021-05-25 MED ORDER — METOPROLOL SUCCINATE ER 50 MG PO TB24
100.0000 mg | ORAL_TABLET | Freq: Every day | ORAL | Status: DC
  Administered 2021-05-26: 100 mg via ORAL
  Filled 2021-05-25: qty 2

## 2021-05-25 MED ORDER — PHENYLEPHRINE HCL (PRESSORS) 10 MG/ML IV SOLN
INTRAVENOUS | Status: DC | PRN
  Administered 2021-05-25 (×2): 100 ug via INTRAVENOUS

## 2021-05-25 MED ORDER — MONTELUKAST SODIUM 10 MG PO TABS: 10.0000 mg | ORAL_TABLET | Freq: Every evening | ORAL | Status: DC

## 2021-05-25 MED ORDER — CEFAZOLIN SODIUM-DEXTROSE 2-4 GM/100ML-% IV SOLN
2.0000 g | INTRAVENOUS | Status: AC
  Administered 2021-05-25: 2 g via INTRAVENOUS

## 2021-05-25 MED ORDER — CHLORHEXIDINE GLUCONATE 0.12 % MT SOLN
OROMUCOSAL | Status: AC
  Administered 2021-05-25: 15 mL via OROMUCOSAL
  Filled 2021-05-25: qty 15

## 2021-05-25 MED ORDER — HYDROCODONE-ACETAMINOPHEN 5-325 MG PO TABS
ORAL_TABLET | ORAL | Status: AC
  Filled 2021-05-25: qty 1

## 2021-05-25 MED ORDER — BUPIVACAINE HCL (PF) 0.5 % IJ SOLN
INTRAMUSCULAR | Status: AC
  Filled 2021-05-25: qty 10

## 2021-05-25 MED ORDER — DOCUSATE SODIUM 100 MG PO CAPS
100.0000 mg | ORAL_CAPSULE | Freq: Two times a day (BID) | ORAL | Status: DC
  Administered 2021-05-26 (×2): 100 mg via ORAL
  Filled 2021-05-25 (×2): qty 1

## 2021-05-25 MED ORDER — BENAZEPRIL HCL 20 MG PO TABS
40.0000 mg | ORAL_TABLET | Freq: Every day | ORAL | Status: DC
  Filled 2021-05-25 (×2): qty 2

## 2021-05-25 MED ORDER — BUPIVACAINE HCL (PF) 0.5 % IJ SOLN
INTRAMUSCULAR | Status: DC | PRN
  Administered 2021-05-25: 2.5 mL

## 2021-05-25 MED ORDER — KETAMINE HCL 50 MG/5ML IJ SOSY
PREFILLED_SYRINGE | INTRAMUSCULAR | Status: AC
  Filled 2021-05-25: qty 5

## 2021-05-25 MED ORDER — POLYETHYLENE GLYCOL 3350 17 G PO PACK: 17.0000 g | PACK | Freq: Every day | ORAL | Status: DC | PRN

## 2021-05-25 SURGICAL SUPPLY — 47 items
ANCHOR SUT BIO SW 4.75X19.1 (Anchor) ×2 IMPLANT
BNDG ELASTIC 6X5.8 VLCR NS LF (GAUZE/BANDAGES/DRESSINGS) ×2 IMPLANT
CHLORAPREP W/TINT 26 (MISCELLANEOUS) ×2 IMPLANT
CUFF TOURN SGL QUICK 24 (TOURNIQUET CUFF)
CUFF TOURN SGL QUICK 34 (TOURNIQUET CUFF)
CUFF TRNQT CYL 24X4X16.5-23 (TOURNIQUET CUFF) IMPLANT
CUFF TRNQT CYL 34X4.125X (TOURNIQUET CUFF) IMPLANT
DRAPE INCISE IOBAN 66X45 STRL (DRAPES) ×2 IMPLANT
ELECT CAUTERY BLADE 6.4 (BLADE) ×2 IMPLANT
ELECT REM PT RETURN 9FT ADLT (ELECTROSURGICAL) ×2
ELECTRODE REM PT RTRN 9FT ADLT (ELECTROSURGICAL) ×1 IMPLANT
GAUZE 4X4 16PLY ~~LOC~~+RFID DBL (SPONGE) ×2 IMPLANT
GAUZE SPONGE 4X4 12PLY STRL (GAUZE/BANDAGES/DRESSINGS) IMPLANT
GAUZE XEROFORM 1X8 LF (GAUZE/BANDAGES/DRESSINGS) IMPLANT
GLOVE SURG SYN 9.0  PF PI (GLOVE) ×1
GLOVE SURG SYN 9.0 PF PI (GLOVE) ×1 IMPLANT
GOWN SRG 2XL LVL 4 RGLN SLV (GOWNS) ×1 IMPLANT
GOWN STRL NON-REIN 2XL LVL4 (GOWNS) ×1
GOWN STRL REUS W/ TWL LRG LVL3 (GOWN DISPOSABLE) ×1 IMPLANT
GOWN STRL REUS W/TWL LRG LVL3 (GOWN DISPOSABLE) ×1
IMMBOLIZER KNEE 19 BLUE UNIV (SOFTGOODS) ×2 IMPLANT
IMMOB KNEE 24 THIGH 24 443303 (SOFTGOODS) IMPLANT
KIT PREVENA INCISION MGT20CM45 (CANNISTER) ×2 IMPLANT
KIT TRANSTIBIAL (DISPOSABLE) ×2 IMPLANT
KIT TURNOVER KIT A (KITS) ×2 IMPLANT
MANIFOLD NEPTUNE II (INSTRUMENTS) ×2 IMPLANT
NDL MAYO CATGUT SZ1 (NEEDLE) ×2
NEEDLE MAYO CATGUT SZ1 (NEEDLE) ×1 IMPLANT
NS IRRIG 500ML POUR BTL (IV SOLUTION) ×2 IMPLANT
PACK TOTAL KNEE (MISCELLANEOUS) ×2 IMPLANT
PAD ABD DERMACEA PRESS 5X9 (GAUZE/BANDAGES/DRESSINGS) IMPLANT
SCALPEL PROTECTED #10 DISP (BLADE) ×4 IMPLANT
SPONGE T-LAP 18X18 ~~LOC~~+RFID (SPONGE) ×6 IMPLANT
STAPLER SKIN PROX 35W (STAPLE) ×2 IMPLANT
SUT ETHIBOND NAB CT1 #1 30IN (SUTURE) ×2 IMPLANT
SUT FIBERWIRE #2 38 T-5 BLUE (SUTURE) ×2
SUT V-LOC 90 ABS DVC 3-0 CL (SUTURE) ×2 IMPLANT
SUT VIC AB 0 CT1 27 (SUTURE)
SUT VIC AB 0 CT1 27XCR 8 STRN (SUTURE) IMPLANT
SUT VIC AB 0 CT1 36 (SUTURE) ×2 IMPLANT
SUT VIC AB 2-0 CT1 27 (SUTURE) ×1
SUT VIC AB 2-0 CT1 TAPERPNT 27 (SUTURE) ×1 IMPLANT
SUTURE FIBERWR #2 38 T-5 BLUE (SUTURE) ×1 IMPLANT
SYR 10ML LL (SYRINGE) ×2 IMPLANT
SYS INTERNAL BRACE KNEE (Miscellaneous) ×2 IMPLANT
SYSTEM INTERNAL BRACE KNEE (Miscellaneous) ×1 IMPLANT
WATER STERILE IRR 500ML POUR (IV SOLUTION) IMPLANT

## 2021-05-25 NOTE — Anesthesia Preprocedure Evaluation (Signed)
Anesthesia Evaluation  Patient identified by MRN, date of birth, ID band Patient awake    Reviewed: Allergy & Precautions, NPO status , Patient's Chart, lab work & pertinent test results  History of Anesthesia Complications Negative for: history of anesthetic complications  Airway Mallampati: II  TM Distance: <3 FB Neck ROM: Full    Dental  (+) Teeth Intact, Implants, Caps   Pulmonary shortness of breath and with exertion, asthma , neg sleep apnea, neg COPD, Patient abstained from smoking.Not current smoker,    Pulmonary exam normal breath sounds clear to auscultation       Cardiovascular Exercise Tolerance: Good METShypertension, (-) CAD and (-) Past MI (-) dysrhythmias  Rhythm:Regular Rate:Normal - Systolic murmurs    Neuro/Psych negative neurological ROS  negative psych ROS   GI/Hepatic GERD  Medicated,(+)     (-) substance abuse  ,   Endo/Other  neg diabetesHypothyroidism   Renal/GU CRFRenal disease     Musculoskeletal  (+) Arthritis ,   Abdominal   Peds  Hematology  (+) anemia ,   Anesthesia Other Findings Past Medical History: No date: Anemia No date: Arthritis No date: Asthma     Comment:  WELL CONTROLLED 2009: Breast cancer ([place])     Comment:  right  No date: Cancer ([place])     Comment:  RIGHT BREAST No date: Chronic kidney disease     Comment:  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 BUT               WAS FINALLY TAKEN OFF No date: Dyspnea     Comment:  DUE TO ASTHMA No date: GERD (gastroesophageal reflux disease) No date: Hypertension No date: 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: Personal history of radiation therapy     Comment:  right breast No date: Sarcoidosis  Reproductive/Obstetrics                                                              Anesthesia Evaluation  Patient identified by MRN,  date of birth, ID band Patient awake    Reviewed: Allergy & Precautions, H&P , NPO status , Patient's Chart, lab work & pertinent test results, reviewed documented beta blocker date and time   History of Anesthesia Complications Negative for: history of anesthetic complications  Airway Mallampati: I  TM Distance: >3 FB Neck ROM: full    Dental  (+) Dental Advidsory Given, Caps, Teeth Intact   Pulmonary neg shortness of breath, asthma , neg sleep apnea, neg COPD, neg recent URI,    Pulmonary exam normal        Cardiovascular Exercise Tolerance: Good hypertension, (-) angina(-) Past MI and (-) Cardiac Stents Normal cardiovascular exam(-) dysrhythmias (-) Valvular Problems/Murmurs     Neuro/Psych negative neurological ROS  negative psych ROS   GI/Hepatic Neg liver ROS, GERD  ,  Endo/Other  neg diabetesHypothyroidism   Renal/GU CRFRenal disease  negative genitourinary   Musculoskeletal   Abdominal   Peds  Hematology negative hematology ROS (+)   Anesthesia Other Findings Past Medical History: No date: Anemia No date: Arthritis No date: Asthma     Comment:  WELL CONTROLLED No date: Cancer ([facility])     Comment:  RIGHT BREAST No  date: Chronic kidney disease     Comment:  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 BUT               WAS FINALLY TAKEN OFF No date: Dyspnea     Comment:  DUE TO ASTHMA No date: GERD (gastroesophageal reflux disease) No date: Hypertension No date: 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 No date: Sarcoidosis   Reproductive/Obstetrics negative OB ROS                             Anesthesia Physical Anesthesia Plan  ASA: III  Anesthesia Plan: Spinal   Post-op Pain Management:    Induction:   PONV Risk Score and Plan: 2 and Propofol infusion and TIVA  Airway Management Planned: Natural Airway and Simple Face Mask  Additional Equipment:    Intra-op Plan:   Post-operative Plan:   Informed Consent: I have reviewed the patients History and Physical, chart, labs and discussed the procedure including the risks, benefits and alternatives for the proposed anesthesia with the patient or authorized representative who has indicated his/her understanding and acceptance.     Dental Advisory Given  Plan Discussed with: Anesthesiologist, CRNA and Surgeon  Anesthesia Plan Comments:         Anesthesia Quick Evaluation  Anesthesia Physical  Anesthesia Plan  ASA: 2  Anesthesia Plan: Spinal   Post-op Pain Management:    Induction: Intravenous  PONV Risk Score and Plan: 3 and Ondansetron, Dexamethasone and Treatment may vary due to age or medical condition  Airway Management Planned: Natural Airway  Additional Equipment: None  Intra-op Plan:   Post-operative Plan:   Informed Consent: I have reviewed the patients History and Physical, chart, labs and discussed the procedure including the risks, benefits and alternatives for the proposed anesthesia with the patient or authorized representative who has indicated his/her understanding and acceptance.       Plan Discussed with: CRNA and Surgeon  Anesthesia Plan Comments: (Discussed R/B/A of neuraxial anesthesia technique with patient: - rare risks of spinal/epidural hematoma, nerve damage, infection - Risk of PDPH - Risk of nausea and vomiting - Risk of conversion to general anesthesia and its associated risks, including sore throat, damage to lips/teeth/oropharynx, and rare risks such as cardiac and respiratory events. - Risk of allergic reactions Patient voiced understanding.)        Anesthesia Quick Evaluation

## 2021-05-25 NOTE — Op Note (Signed)
05/25/2021  11:49 AM  PATIENT:  Kelly Pena  80 y.o. female  PRE-OPERATIVE DIAGNOSIS:  Status post revision of total knee replacement, right Z96.651 Patellar tendon rupture, right, initial encounter S86.811A  POST-OPERATIVE DIAGNOSIS:  Status post revision of total knee replacement, right Z96.651 Patellar tendon rupture, right, initial encounter S86.811A  PROCEDURE:  Procedure(s): PATELLA TENDON REPAIR (Right)  SURGEON: Laurene Footman, MD  ASSISTANTS: None  ANESTHESIA:   spinal  EBL:  Total I/O In: 500 [I.V.:500] Out: 5 [Blood:5]  BLOOD ADMINISTERED:none  DRAINS:  Incisional wound VAC    LOCAL MEDICATIONS USED:  NONE  SPECIMEN:  No Specimen  DISPOSITION OF SPECIMEN:  N/A  COUNTS:  YES  TOURNIQUET:   Total Tourniquet Time Documented: Thigh (Right) - 43 minutes Total: Thigh (Right) - 43 minutes   IMPLANTS: Arthrex suture anchor x2  DICTATION: .Dragon Dictation patient was brought to the operating room and after adequate anesthesia was obtained the right leg was prepped and draped you sterile fashion.  After patient identification timeout procedures tourniquet was raised and going to the prior midline incision the patella was exposed proximally and on the soft tissue exposed medial and lateral to this distally getting the layer of the patellar tendon identified.  There was area of rupture just distal to the patella with extensive attenuation medial and lateral to this.  After this was identified 2 sutures were placed one fiber tape 1 FiberWire going along the medial and lateral patellar tendons up around the superior aspect of the patella with locking sutures and anchor then placed in the center midline first with a FiberWire and then subsequent the fiber tape holding the patella and reduced position.  There was the attenuated area was then tightened up with a few 0 Ethibond sutures in a figure-of-eight fashion to get the patellar tendon to appropriate length.  Following this  with range of motion the construct was stable to 90 degrees the wound was thoroughly irrigated and closed with 3-0 V-Loc subcutaneous and skin staples followed by Praveena wound VAC.  PLAN OF CARE: Admit for overnight observation  PATIENT DISPOSITION:  PACU - hemodynamically stable.

## 2021-05-25 NOTE — Progress Notes (Signed)
Patient awake/alert x4. Able to move bil lower ext, +CMS intact Patient states she is comfortable, waiting on bed assignment. Vitals at baseline. Tolerated gingerale and crackers without event.

## 2021-05-25 NOTE — Anesthesia Procedure Notes (Addendum)
Spinal  Patient location during procedure: OR Start time: 05/25/2021 10:19 AM End time: 05/25/2021 10:24 AM Reason for block: surgical anesthesia Staffing Performed: anesthesiologist  Anesthesiologist: Arita Miss, MD Resident/CRNA: Daryel Gerald, RN Preanesthetic Checklist Completed: patient identified, IV checked, site marked, risks and benefits discussed, surgical consent, monitors and equipment checked, pre-op evaluation and timeout performed Spinal Block Patient position: sitting Prep: DuraPrep Patient monitoring: heart rate, cardiac monitor, continuous pulse ox and blood pressure Approach: midline Location: L4-5 Injection technique: single-shot Needle Needle type: Quincke (multiple attempts with pencan and quincke used to get CSF)  Needle gauge: 22 G Needle length: 9 cm Assessment Sensory level: T4 Events: CSF return

## 2021-05-25 NOTE — Anesthesia Postprocedure Evaluation (Signed)
Anesthesia Post Note  Patient: Caleah Tortorelli  Procedure(s) Performed: PATELLA TENDON REPAIR (Right: Knee)  Patient location during evaluation: PACU Anesthesia Type: Spinal Level of consciousness: oriented and awake and alert Pain management: pain level controlled Vital Signs Assessment: post-procedure vital signs reviewed and stable Respiratory status: spontaneous breathing, respiratory function stable and patient connected to nasal cannula oxygen Cardiovascular status: blood pressure returned to baseline and stable Postop Assessment: no headache, no backache and no apparent nausea or vomiting Anesthetic complications: no   No notable events documented.   Last Vitals:  Vitals:   05/25/21 1230 05/25/21 1240  BP: 137/78 (!) 142/79  Pulse: 67 63  Resp: 13 13  Temp:  (!) 36.2 C  SpO2: 99% 99%    Last Pain:  Vitals:   05/25/21 1210  TempSrc:   PainSc: 0-No pain                 Arita Miss

## 2021-05-25 NOTE — Transfer of Care (Signed)
Immediate Anesthesia Transfer of Care Note  Patient: Kelly Pena  Procedure(s) Performed: PATELLA TENDON REPAIR (Right: Knee)  Patient Location: PACU  Anesthesia Type:Spinal  Level of Consciousness: awake, alert  and oriented  Airway & Oxygen Therapy: Patient Spontanous Breathing and Patient connected to face mask oxygen  Post-op Assessment: Report given to RN and Post -op Vital signs reviewed and stable  Post vital signs: Reviewed and stable  Last Vitals:  Vitals Value Taken Time  BP 134/66   Temp    Pulse 70   Resp 16   SpO2 100     Last Pain:  Vitals:   05/25/21 0925  TempSrc: Temporal  PainSc: 0-No pain      Patients Stated Pain Goal: 0 (67/28/97 9150)  Complications: No notable events documented.

## 2021-05-25 NOTE — OR Nursing (Signed)
Foley attempted x3, unsuccessful and surgeon aware. Urology consult. Foley not placed at this time. Surgeon and urologist spoke.

## 2021-05-25 NOTE — H&P (Signed)
Chief Complaint  Patient presents with   Right Knee - Post Operative Visit    History of the Present Illness: Kelly Pena is a 80 y.o. female here today.   The patient presents for follow-up evaluation status post revision of all components from 04/08/2021. This is a visit with x-ray today.  I have reviewed past medical, surgical, social and family history, and allergies as documented in the EMR.  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 Rudene Christians   REVISION TOTAL KNEE ARTHROPLASTY Right 04/08/2021   Right knee revision Right 04/08/2021   Tumor removal ankle Left   Past Family History: Family History  Problem Relation Age of Onset   Breast cancer Mother   Hyperlipidemia (Elevated cholesterol) Father   High blood pressure (Hypertension) Father   Myocardial Infarction (Heart attack) Father   Skin cancer Father   Prostate cancer Brother   High blood pressure (Hypertension) Brother   Hyperlipidemia (Elevated cholesterol) Brother   Stroke Maternal Grandmother   Cancer Maternal Grandfather   Myocardial Infarction (Heart attack) Paternal Grandmother   Medications: Current Outpatient Medications Ordered in Epic  Medication Sig Dispense Refill   acetaminophen (TYLENOL) 500 MG tablet  Take 1 tablet by mouth every 6 (six) hours as needed   albuterol 90 mcg/actuation inhaler Inhale 2 inhalations into the lungs every 6 (six) hours as needed for Wheezing   amLODIPine-benazepril (LOTREL) 5-40 mg capsule TAKE 1 CAPSULE BY MOUTH EVERY DAY 90 capsule 1   amoxicillin (AMOXIL) 500 MG capsule Take 2,000 mg by mouth as needed 1 hour prior to dental appointment   calcium carbonate (TUMS) 200 mg calcium (500 mg) chewable tablet Take 1 tablet by mouth as needed   levothyroxine (SYNTHROID) 25 MCG tablet TAKE 1 TABLET (25 MCG TOTAL) BY MOUTH ONCE DAILY TAKE ON AN EMPTY STOMACH WITH A GLASS OF WATER AT LEAST 30-60 MINUTES BEFORE BREAKFAST. 90 tablet 1   methocarbamoL (ROBAXIN) 500 MG tablet Take 1 tablet by mouth every 6 (six) hours as needed   metoprolol succinate (TOPROL-XL) 100 MG XL tablet TAKE 1 TABLET BY MOUTH EVERY DAY 90 tablet 3   montelukast (SINGULAIR) 10 mg tablet TAKE 1 TABLET BY MOUTH EVERY DAY AT NIGHT 90 tablet 3   multivitamin tablet Take 1 tablet by mouth once daily   omeprazole (PRILOSEC) 20 MG DR capsule TAKE 1 CAPSULE BY MOUTH EVERY DAY 90 capsule 3   WIXELA INHUB 250-50 mcg/dose diskus inhaler INHALE 1 INHALATION INTO THE LUNGS EVERY 12 HOURS 180 each 3   docusate (COLACE) 100 MG capsule Take 1 capsule by mouth 2 (two) times daily (Patient not taking: Reported on 05/21/2021)   HYDROcodone-acetaminophen (NORCO) 5-325 mg tablet Take 1-2 tablets by mouth every 4 (four) hours as needed (Patient not taking: Reported on 05/21/2021)   traMADoL (ULTRAM)  50 mg tablet Take 1 tablet (50 mg total) by mouth every 6 (six) hours as needed for Pain (Patient not taking: No sig reported) 20 tablet 3   No current Epic-ordered facility-administered medications on file.   Allergies: Allergies  Allergen Reactions   Sulfa (Sulfonamide Antibiotics) Shortness Of Breath  difficulty breathing   Sulfur Shortness Of Breath   Magnesium Salicylate-Caffeine Unknown   Tramadol Hcl Nausea    Hydrochlorothiazide Rash    Body mass index is 36.56 kg/m.  Review of Systems: A comprehensive 14 point ROS was performed, reviewed, and the pertinent orthopaedic findings are documented in the HPI.  Vitals:  05/21/21 0951  BP: 134/88    General Physical Examination:   General/Constitutional: No apparent distress: well-nourished and well developed. Eyes: Pupils equal, round with synchronous movement. Lungs: Clear to auscultation HEENT: Normal Vascular: No edema, swelling or tenderness, except as noted in detailed exam. Cardiac: Heart rate and rhythm is regular. Integumentary: No impressive skin lesions present, except as noted in detailed exam. Neuro/Psych: Normal mood and affect, oriented to person, place and time.  On exam, 20 degrees of extension lag. Palpable defect to the patella that is hypermobile.  Radiographs:  AP, lateral, standing, and sunrise x-rays of the right knee were ordered and personally reviewed today. These show long stemmed femoral and tibial components with total stabilized femoral component. The knee had previously not been resurfaced; however, is now. Alignment is normal. Lateral view shows high-riding patella. Comparing today's x-ray to postoperative x-ray from 04/08/2021, the patella is riding up more than 1 cm higher, consistent with patellar tendon rupture.  X-ray Impression Stable appearance of the components, but patellar tendon rupture apparent.  Assessment: ICD-10-CM  1. 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 Patellar tendon rupture, right, initial encounter S86.811A   Plan:  The patient has clinical findings of stable appearance of the components, but patellar tendon rupture apparent.  We discussed the patient's x-ray findings. I explained she may have ruptured her tendon, at least partially. We will have [name], DO  evaluate her with an ultrasound. We want preform surgery to repair that and use FiberWire with multiple anchors to get her patellar tendon back, and keep her leg extended for approximately 4 weeks.  The patient will follow up for surgery.  Scribe Attestation: I, Dawn Royse, am acting as scribe for [employer], MD.   Electronically signed by Lauris Poag, MD at 05/23/2021 12:33 PM EDT  Reviewed  H+P. No changes noted.

## 2021-05-26 ENCOUNTER — Encounter: Payer: Self-pay | Admitting: Orthopedic Surgery

## 2021-05-26 DIAGNOSIS — S86811A Strain of other muscle(s) and tendon(s) at lower leg level, right leg, initial encounter: Secondary | ICD-10-CM | POA: Diagnosis not present

## 2021-05-26 LAB — CBC
HCT: 30.5 % — ABNORMAL LOW (ref 36.0–46.0)
Hemoglobin: 9.9 g/dL — ABNORMAL LOW (ref 12.0–15.0)
MCH: 28.2 pg (ref 26.0–34.0)
MCHC: 32.5 g/dL (ref 30.0–36.0)
MCV: 86.9 fL (ref 80.0–100.0)
Platelets: 184 10*3/uL (ref 150–400)
RBC: 3.51 MIL/uL — ABNORMAL LOW (ref 3.87–5.11)
RDW: 13 % (ref 11.5–15.5)
WBC: 5.8 10*3/uL (ref 4.0–10.5)
nRBC: 0 % (ref 0.0–0.2)

## 2021-05-26 LAB — CREATININE, SERUM
Creatinine, Ser: 1.19 mg/dL — ABNORMAL HIGH (ref 0.44–1.00)
GFR, Estimated: 47 mL/min — ABNORMAL LOW (ref >60)

## 2021-05-26 MED ORDER — POLYETHYLENE GLYCOL 3350 17 G PO PACK: 17.0000 g | PACK | Freq: Every day | ORAL | 0 refills | Status: AC | PRN

## 2021-05-26 MED ORDER — HYDROCODONE-ACETAMINOPHEN 5-325 MG PO TABS: 1.0000 | ORAL_TABLET | ORAL | 0 refills | Status: AC | PRN

## 2021-05-26 NOTE — Discharge Instructions (Signed)
Diet: As you were doing prior to hospitalization   Dressing: Keep knee immobilizer on at all times.  Do not allow your knee to go into flexion.  Follow-up with [facility] orthopedics and 1 week for Praveena removal and dressing change.  Keep incision site clean and dry at all times.  Activity:  Increase activity slowly as tolerated, but follow the weight bearing instructions below.  No lifting or driving for 6 weeks.  Weight Bearing:   Weight bearing as tolerated to right lower extremity  To prevent constipation: you may use a stool softener such as -  Colace (over the counter) 100 mg by mouth twice a day  Drink plenty of fluids (prune juice may be helpful) and high fiber foods Miralax (over the counter) for constipation as needed.    Itching:  If you experience itching with your medications, try taking only a single pain pill, or even half a pain pill at a time.  You may take up to 10 pain pills per day, and you can also use benadryl over the counter for itching or also to help with sleep.   Precautions:  If you experience chest pain or shortness of breath - call 911 immediately for transfer to the hospital emergency department!!  If you develop a fever greater that 101 F, purulent drainage from wound, increased redness or drainage from wound, or calf pain-Call [place]                                              Follow- Up Appointment:  Please call for an appointment to be seen in 2 weeks at [facility]

## 2021-05-26 NOTE — Plan of Care (Signed)
Patient discharged home per MD orders at this time.All discharge instructions,education and medications reviewed with patient and spouse at the bedside.Patient expressed understanding and will comply with dc instructions.follow up appointments was also communicated to patient.no verbal c/o or any ssx of distress.Pt was discharged home with HH/PT services.patient was transported home by spouse in a private car.

## 2021-05-26 NOTE — TOC Progression Note (Signed)
Transition of Care [facility]) - Progression Note    Patient Details  Name: Kelly Pena MRN: [id number] Date of Birth: Jun 03, 1941  Transition of Care [facility]) CM/SW [place], RN Phone Number: 05/26/2021, 2:03 PM  Clinical Narrative:   Patient lives at home with spouse.  No concerns about transportation to appointments or pharmacy, able to take medications as directed.    MD does not recommend HH at this time.  Patient has walker and raised toilet seat at home, declines consult for DME.  Has transportation home, to [facility] with spouse.         Expected Discharge Plan and Services           Expected Discharge Date: 05/26/21                                     Social Determinants of Health (SDOH) Interventions    Readmission Risk Interventions No flowsheet data found.

## 2021-05-26 NOTE — Progress Notes (Signed)
   Subjective: 1 Day Post-Op Procedure(s) (LRB): PATELLA TENDON REPAIR (Right) Patient reports pain as mild.   Patient is well, and has had no acute complaints or problems Denies any CP, SOB, ABD pain. We will continue therapy today.  Plan is to go Home after hospital stay.  Objective: Vital signs in last 24 hours: Temp:  [97.1 F (36.2 C)-98.4 F (36.9 C)] 97.9 F (36.6 C) (09/21 0751) Pulse Rate:  [58-84] 65 (09/21 0751) Resp:  [9-26] 18 (09/21 0751) BP: (96-171)/(54-94) 159/81 (09/21 0751) SpO2:  [92 %-100 %] 98 % (09/21 0751) Weight:  [97.5 kg] 97.5 kg (09/20 0925)  Intake/Output from previous day: 09/20 0701 - 09/21 0700 In: 1656.3 [P.O.:360; I.V.:1196.3; IV Piggyback:100] Out: 1605 [Urine:1600; Blood:5] Intake/Output this shift: No intake/output data recorded.  Recent Labs    05/26/21 0456  HGB 9.9*   Recent Labs    05/26/21 0456  WBC 5.8  RBC 3.51*  HCT 30.5*  PLT 184   Recent Labs    05/26/21 0456  CREATININE 1.19*   No results for input(s): LABPT, INR in the last 72 hours.  EXAM General - Patient is Alert, Appropriate, and Oriented Extremity - Neurovascular intact Sensation intact distally Intact pulses distally Dressing - dressing C/D/I and no drainage, Praveena intact without drainage Motor Function - intact, moving foot and toes well on exam.   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   Weight as of this encounter: 97.5 kg. Advance diet Up with therapy Discharge home today.  Follow-up with [facility] orthopedics in 1 week for Praveena removal and dressing change.  Patient to keep knee straight at all times for 3 to 4 weeks.  DVT Prophylaxis - TED hose Weight-Bearing as tolerated to right leg   T. Rachelle Hora, PA-C [place] 05/26/2021, 8:03 AM

## 2021-05-26 NOTE — Discharge Summary (Signed)
Physician Discharge Summary  Patient ID: Kelly Pena MRN: [id number] DOB/AGE: 1940/12/02 80 y.o.  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 female who was admitted 05/25/2021 with a diagnosis of right knee patellar tendon rupture and went to the operating room on 05/25/2021 and underwent the above named procedures.    Surgeries: Procedure(s): PATELLA TENDON REPAIR on 05/25/2021 Patient tolerated the surgery well. Taken to PACU where she was stabilized and then transferred to the orthopedic floor.  Started on Lovenox.  Teds, foot pumps applied bilaterally at 80 mm. Heels elevated on bed with rolled towels. No evidence of DVT. Negative [place]. Physical therapy started on day #1 for gait training and transfer. OT started day #1 for ADL and assisted devices.  Patient's foley was d/c on day #1. Patient's IV was d/c on day #1.  On post op day #1 patient was stable and ready for discharge to home.   She was given perioperative antibiotics:  Anti-infectives (From admission,  onward)    Start     Dose/Rate Route Frequency Ordered Stop   05/25/21 1600  ceFAZolin (ANCEF) IVPB 2g/100 mL premix        2 g 200 mL/hr over 30 Minutes Intravenous Every 6 hours 05/25/21 1518 05/26/21 0441   05/25/21 0854  ceFAZolin (ANCEF) 2-4 GM/100ML-% IVPB       Note to Pharmacy: Register, Karen   : cabinet override      05/25/21 0854 05/25/21 2308   05/25/21 0600  ceFAZolin (ANCEF) IVPB 2g/100 mL premix        2 g 200 mL/hr over 30 Minutes Intravenous On call to O.R. 05/25/21 0319 05/25/21 1105     .  She was given sequential compression devices, early ambulation  She benefited maximally from the hospital stay and there were no complications.    Recent vital signs:  Vitals:   05/26/21 0423 05/26/21 0751  BP: (!) 152/78 (!) 159/81  Pulse:  65  Resp:  18  Temp:  97.9 F (36.6 C)  SpO2:  98%    Recent laboratory studies:  Lab Results  Component Value Date   HGB 9.9 (L) 05/26/2021   HGB 9.1 (L) 04/09/2021   HGB 9.8 (L) 04/08/2021   Lab Results  Component Value Date   WBC 5.8 05/26/2021   PLT 184 05/26/2021   No results found for: INR Lab Results  Component Value Date   NA 135 04/09/2021  K 3.9 04/09/2021   CL 106 04/09/2021   CO2 24 04/09/2021   BUN 22 04/09/2021   CREATININE 1.19 (H) 05/26/2021   GLUCOSE 118 (H) 04/09/2021    Discharge Medications:   Allergies as of 05/26/2021       Reactions   Elemental Sulfur Shortness Of Breath   Cantaloupe (diagnostic) Diarrhea   Other    CATS-EXACERBATES ASTHMA   Tramadol Hcl Nausea Only   Hydrochlorothiazide Rash        Medication List     STOP taking these medications    docusate sodium 100 MG capsule Commonly known as: COLACE   enoxaparin 40 MG/0.4ML injection Commonly known as: LOVENOX   methocarbamol 500 MG tablet Commonly known as: ROBAXIN       TAKE these medications    acetaminophen 500 MG tablet Commonly known as: TYLENOL Take 500 mg by mouth every 6 (six) hours as needed for  moderate pain.   albuterol 108 (90 Base) MCG/ACT inhaler Commonly known as: VENTOLIN HFA Inhale 1-2 puffs into the lungs every 6 (six) hours as needed for wheezing or shortness of breath.   amLODipine-benazepril 5-40 MG capsule Commonly known as: LOTREL Take 1 capsule by mouth in the morning.   calcium carbonate 500 MG chewable tablet Commonly known as: TUMS - dosed in mg elemental calcium Chew 1 tablet by mouth 2 (two) times daily as needed for indigestion or heartburn.   Fluticasone-Salmeterol 250-50 MCG/DOSE Aepb Commonly known as: ADVAIR Inhale 1 puff into the lungs 2 (two) times daily.   HYDROcodone-acetaminophen 5-325 MG tablet Commonly known as: NORCO/VICODIN Take 1-2 tablets by mouth every 4 (four) hours as needed for moderate pain (pain score 4-6).   levothyroxine 25 MCG tablet Commonly known as: SYNTHROID Take 25 mcg by mouth daily before breakfast.   metoprolol succinate 100 MG 24 hr tablet Commonly known as: TOPROL-XL Take 100 mg by mouth every morning. Take with or immediately following a meal.   montelukast 10 MG tablet Commonly known as: SINGULAIR Take 10 mg by mouth every evening.   omeprazole 20 MG capsule Commonly known as: PRILOSEC Take 20 mg by mouth in the morning.   polyethylene glycol 17 g packet Commonly known as: MIRALAX / GLYCOLAX Take 17 g by mouth daily as needed for mild constipation.        Diagnostic Studies: No results found.  Disposition:      Follow-up Information     Duanne Guess, PA-C Follow up in 1 week(s).   Specialties: Orthopedic Surgery, Emergency Medicine Contact information: [place] [address] [phone]                  Signed: Feliberto Gottron 05/26/2021, 8:10 AM

## 2021-05-26 NOTE — Progress Notes (Signed)
PHARMACIST - PHYSICIAN COMMUNICATION  CONCERNING:  Enoxaparin (Lovenox) for DVT Prophylaxis    RECOMMENDATION: Patient was prescribed enoxaprin 40mg  q24 hours for VTE prophylaxis.   Filed Weights   05/25/21 0925  Weight: 97.5 kg (215 lb)    Body mass index is 36.9 kg/m.  Estimated Creatinine Clearance: 44.2 mL/min (A) (by C-G formula based on SCr of 1.19 mg/dL (H)).   Based on [place] patient is candidate for enoxaparin 0.5mg /kg TBW SQ every 24 hours based on BMI being >30.  DESCRIPTION: Pharmacy has adjusted enoxaparin dose per [facility] policy.  Patient is now receiving enoxaparin 0.5 mg/kg every 24 hours   Renda Rolls, PharmD, [facility] 05/26/2021 5:32 AM

## 2021-05-26 NOTE — Evaluation (Signed)
Physical Therapy Evaluation Patient Details Name: Kelly Pena MRN: [id number] DOB: 07-Jan-1941 Today's Date: 05/26/2021  History of Present Illness  Pleasant 80 y/o who had R TKA revision on 8/4.Apparently had been making good post-op gains but founf to have patellar tendon rupture that was repaired 9/20. PMH includes asthma, HTN, GERD, and renal disease.  Clinical Impression  Pt did well with first bout of ambulation s/p R quad tendon repair.  She apparently had done very well working hard after her R TKA revision last month - is disappointed that she has had this set back but overall did show good ability to manage in-home ambulation and general mobility.  She would be safe to go home, no DME needs, continued PT at d/c per ortho recs.     Recommendations for follow up therapy are one component of a multi-disciplinary discharge planning process, led by the attending physician.  Recommendations may be updated based on patient status, additional functional criteria and insurance authorization.  Follow Up Recommendations Follow surgeon's recommendation for DC plan and follow-up therapies    Equipment Recommendations  None recommended by PT    Recommendations for Other Services       Precautions / Restrictions Precautions Precautions: Fall Required Braces or Orthoses: Knee Immobilizer - Right Restrictions Weight Bearing Restrictions: Yes RLE Weight Bearing: Weight bearing as tolerated Other Position/Activity Restrictions: KI donned      Mobility  Bed Mobility Overal bed mobility: Modified Independent             General bed mobility comments: Pt able to get herself to sitting EOB w/o assist and w/o increased pain    Transfers Overall transfer level: Modified independent Equipment used: Rolling walker (2 wheeled)             General transfer comment: KI donned, cues for appropriate positioning and UE use to rise w/o ability to effectively use R LE but able to rise and  maintain balance w/o direct assist and with good relative confidence and safety  Ambulation/Gait Ambulation/Gait assistance: Supervision Gait Distance (Feet): 100 Feet Assistive device: Rolling walker (2 wheeled)       General Gait Details: Pt able to maintain safe and consistent cadence, did not show hesitancy with R WBing or any signs of buckling (subjective or objective) and though there was some hesitancy she reported good overall confidence about being able to safely manage in-home mobility  Stairs            Wheelchair Mobility    Modified Rankin (Stroke Patients Only)       Balance Overall balance assessment: Modified Independent                                           Pertinent Vitals/Pain Pain Assessment: 0-10 Pain Score: 3  Pain Location: knee cap and surrounding    Home Living Family/patient expects to be discharged to:: Private residence Living Arrangements: Spouse/significant other Available Help at Discharge: Family Type of Home: Independent living facility Home Access: Level entry     Home Layout: One level Home Equipment: Environmental consultant - 2 wheels;Walker - 4 wheels;Cane - single point;Shower seat - built in;Grab bars - tub/shower      Prior Function Level of Independence: Independent with assistive device(s)               Hand Dominance  Extremity/Trunk Assessment   Upper Extremity Assessment Upper Extremity Assessment: Generalized weakness (age appropriate defecits)    Lower Extremity Assessment Lower Extremity Assessment: Generalized weakness (R LE in KI/no ROM, able to show good quad set and ability to SLR)       Communication   Communication: No difficulties  Cognition Arousal/Alertness: Awake/alert Behavior During Therapy: WFL for tasks assessed/performed Overall Cognitive Status: Within Functional Limits for tasks assessed                                        General Comments       Exercises     Assessment/Plan    PT Assessment Patient needs continued PT services  PT Problem List Decreased strength;Decreased range of motion;Decreased activity tolerance;Decreased balance;Decreased safety awareness;Decreased knowledge of use of DME;Decreased mobility;Decreased coordination;Pain       PT Treatment Interventions DME instruction;Gait training;Stair training;Functional mobility training;Therapeutic activities;Therapeutic exercise;Balance training;Neuromuscular re-education;Patient/family education    PT Goals (Current goals can be found in the Care Plan section)  Acute Rehab PT Goals Patient Stated Goal: go home PT Goal Formulation: With patient Time For Goal Achievement: 06/09/21 Potential to Achieve Goals: Good    Frequency 7X/week   Barriers to discharge        Co-evaluation               AM-PAC PT "6 Clicks" Mobility  Outcome Measure Help needed turning from your back to your side while in a flat bed without using bedrails?: None Help needed moving from lying on your back to sitting on the side of a flat bed without using bedrails?: None Help needed moving to and from a bed to a chair (including a wheelchair)?: A Little Help needed standing up from a chair using your arms (e.g., wheelchair or bedside chair)?: A Little Help needed to walk in hospital room?: A Little Help needed climbing 3-5 steps with a railing? : A Lot 6 Click Score: 19    End of Session Equipment Utilized During Treatment: Gait belt Activity Tolerance: Patient tolerated treatment well Patient left: with chair alarm set;with call bell/phone within reach Nurse Communication: Mobility status;Patient requests pain meds PT Visit Diagnosis: Muscle weakness (generalized) (M62.81);Difficulty in walking, not elsewhere classified (R26.2);Other abnormalities of gait and mobility (R26.89);Pain Pain - Right/Left: Right Pain - part of body: Knee    Time: 0829-0903 PT Time  Calculation (min) (ACUTE ONLY): 34 min   Charges:   PT Evaluation $PT Eval Low Complexity: 1 Low PT Treatments $Gait Training: 8-22 mins        Kreg Shropshire, DPT 05/26/2021, 10:10 AM

## 2021-06-09 ENCOUNTER — Encounter: Payer: Self-pay | Admitting: Orthopedic Surgery

## 2021-11-15 ENCOUNTER — Other Ambulatory Visit: Payer: Self-pay | Admitting: Family Medicine

## 2021-11-15 DIAGNOSIS — Z1231 Encounter for screening mammogram for malignant neoplasm of breast: Secondary | ICD-10-CM

## 2021-11-30 ENCOUNTER — Ambulatory Visit
Admission: RE | Admit: 2021-11-30 | Discharge: 2021-11-30 | Disposition: A | Discharge status: Home / Self Care | Payer: Medicare Other | Source: Ambulatory Visit | Attending: Family Medicine | Admitting: Family Medicine

## 2021-11-30 ENCOUNTER — Inpatient Hospital Stay: Admission: RE | Admit: 2021-11-30 | Payer: Medicare Other | Source: Ambulatory Visit

## 2021-11-30 ENCOUNTER — Other Ambulatory Visit: Payer: Self-pay

## 2021-11-30 DIAGNOSIS — Z1231 Encounter for screening mammogram for malignant neoplasm of breast: Secondary | ICD-10-CM | POA: Diagnosis present

## 2022-11-16 ENCOUNTER — Other Ambulatory Visit: Payer: Self-pay | Admitting: Family Medicine

## 2022-11-16 DIAGNOSIS — Z1231 Encounter for screening mammogram for malignant neoplasm of breast: Secondary | ICD-10-CM

## 2022-12-20 ENCOUNTER — Ambulatory Visit
Admission: RE | Admit: 2022-12-20 | Discharge: 2022-12-20 | Disposition: A | Discharge status: Home / Self Care | Payer: Medicare Other | Source: Ambulatory Visit | Attending: Family Medicine | Admitting: Family Medicine

## 2022-12-20 DIAGNOSIS — Z1231 Encounter for screening mammogram for malignant neoplasm of breast: Secondary | ICD-10-CM | POA: Diagnosis present

## 2022-12-28 IMAGING — MG MM DIGITAL SCREENING BILAT W/ TOMO AND CAD
6 of 10 series · 6 of 30 positions shown · non-contrast
Comparison: Previous exam(s).

CLINICAL DATA: Screening.

EXAM:
DIGITAL SCREENING BILATERAL MAMMOGRAM WITH TOMOSYNTHESIS AND CAD
TECHNIQUE: Bilateral screening digital craniocaudal and mediolateral oblique
mammograms were obtained. Bilateral screening digital breast
tomosynthesis was performed. The images were evaluated with
computer-aided detection.

[L MLO synth-2D]
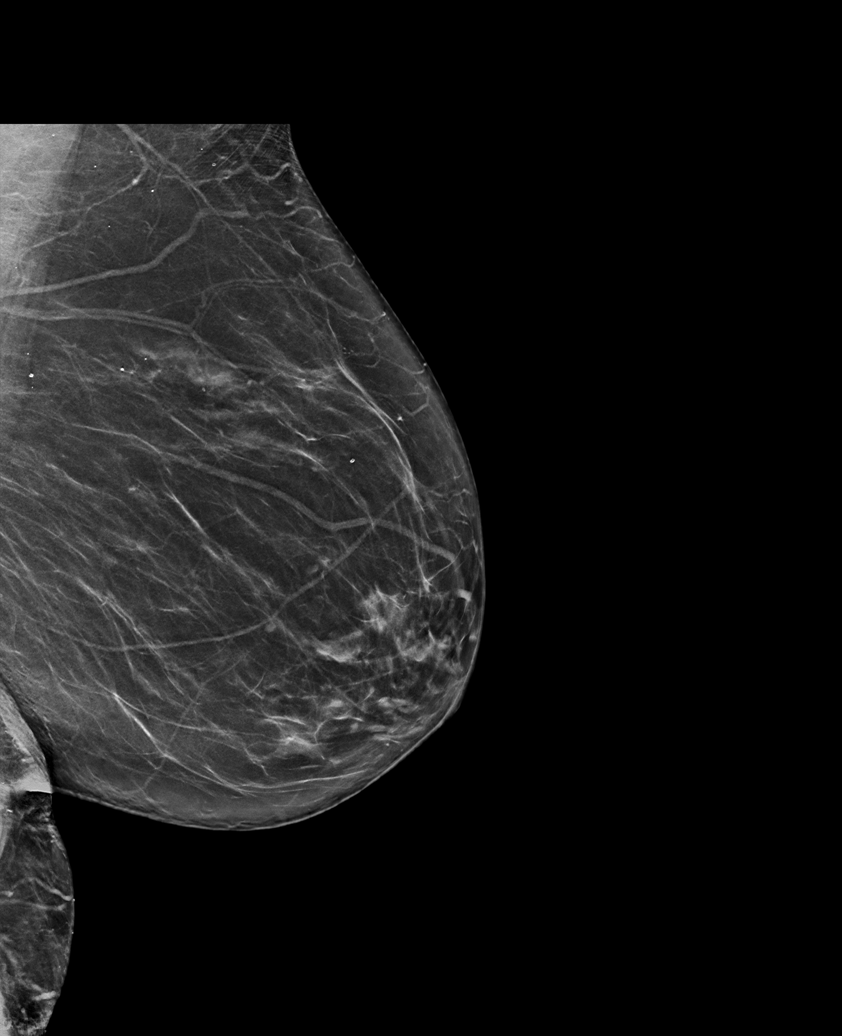

[R MLO synth-2D (1 of 2)]
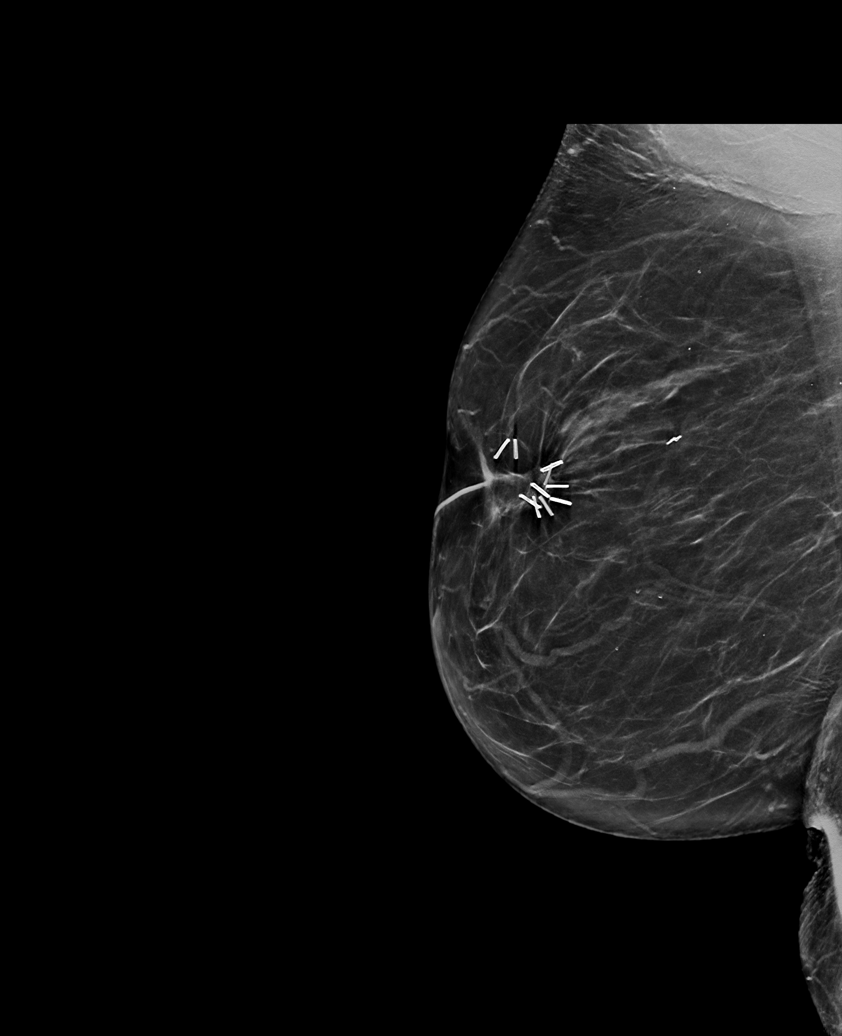

[R MLO synth-2D (2 of 2)]
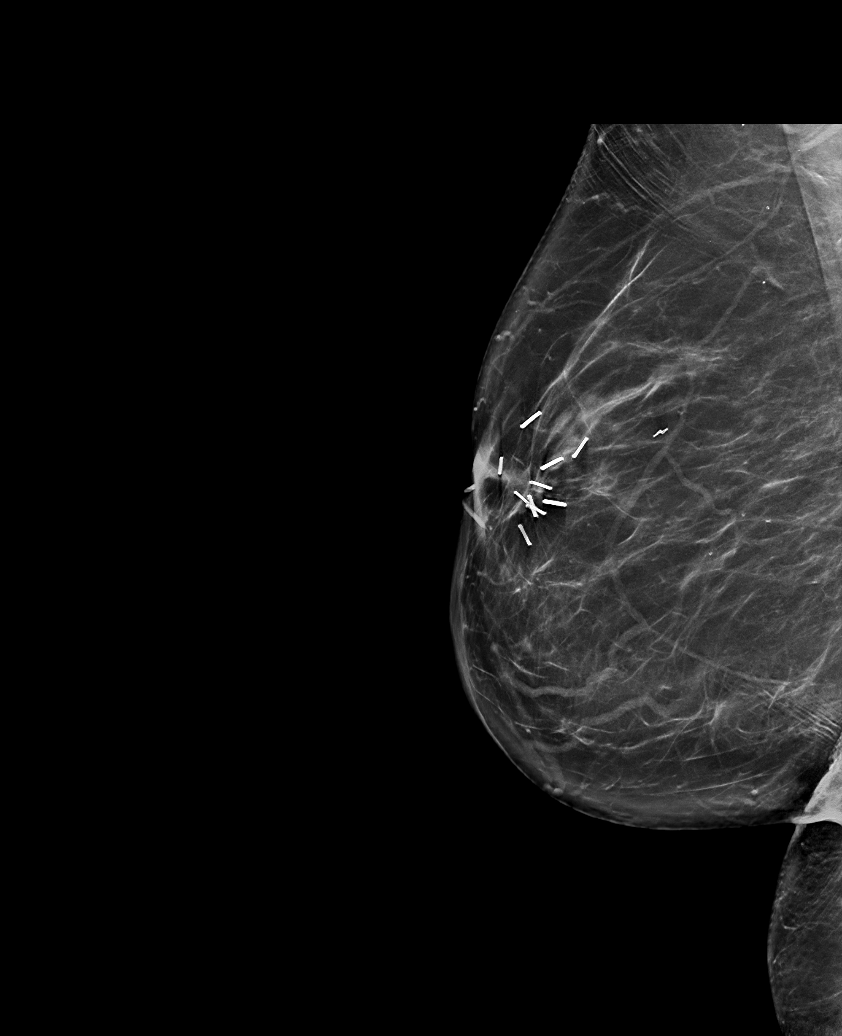

[L CC synth-2D]
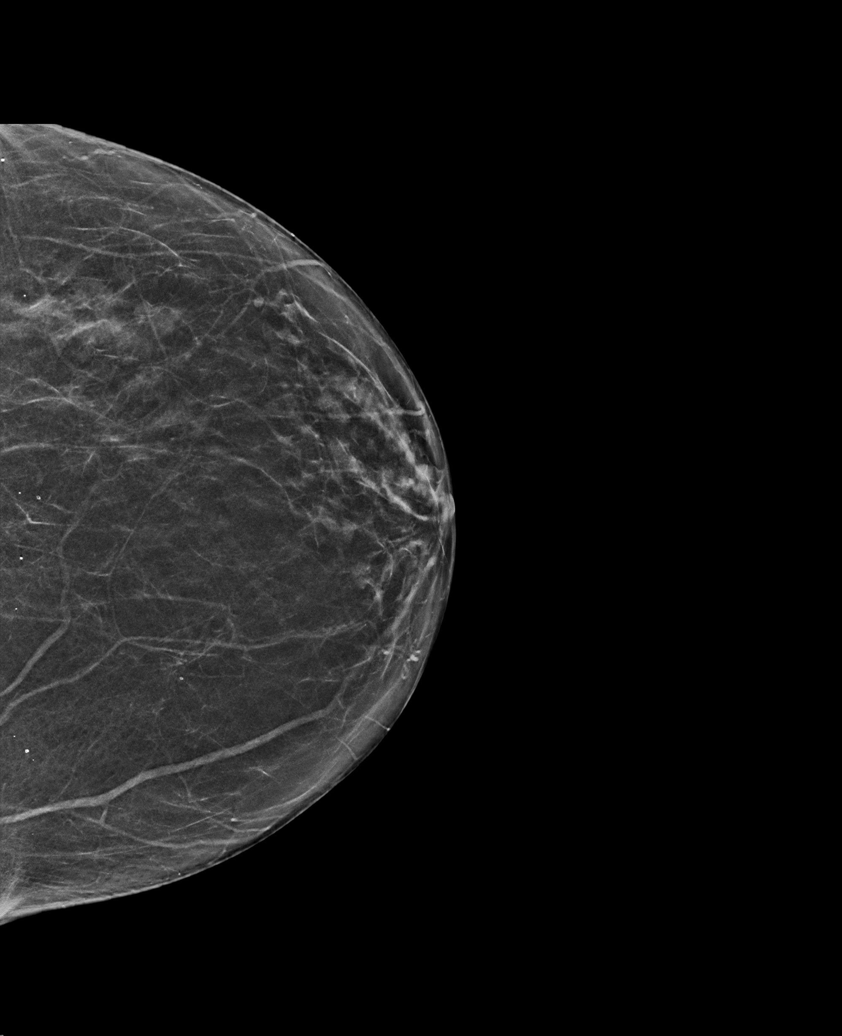

[R CC synth-2D]
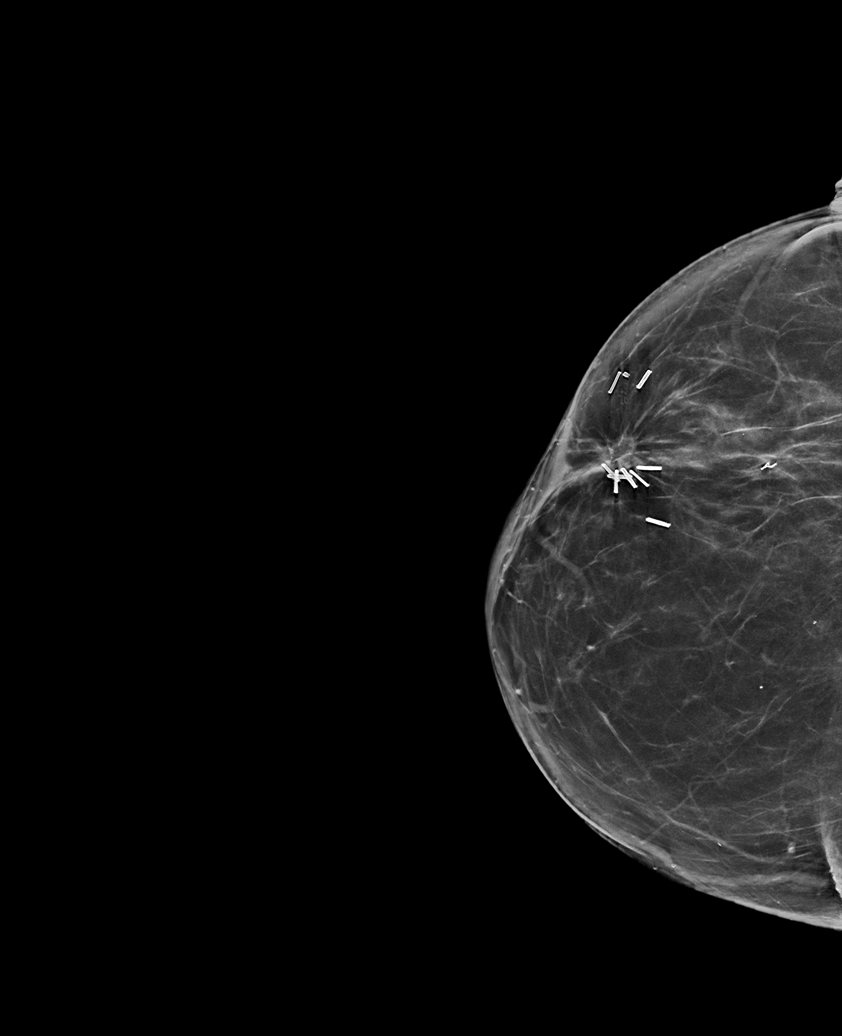

[R CC tomo · tomo slice 36/71.0]
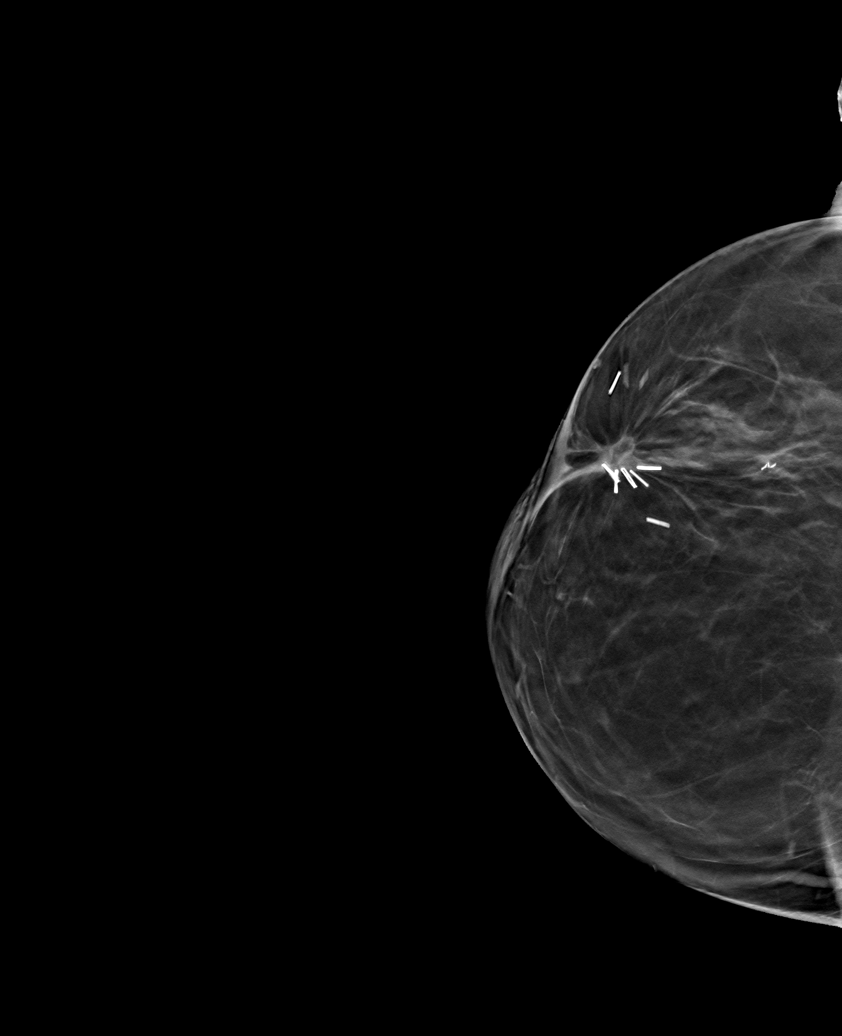

[6 of 30 positions shown; findings below may reference images not displayed]

ACR Breast Density Category b: There are scattered areas of
fibroglandular density.
FINDINGS: There are no findings suspicious for malignancy.
IMPRESSION: No mammographic evidence of malignancy. A result letter of this
screening mammogram will be mailed directly to the patient.

RECOMMENDATION:
Screening mammogram in one year. (Code:51-O-LD2)

BI-RADS CATEGORY  1: Negative.

## 2024-01-23 ENCOUNTER — Other Ambulatory Visit: Payer: Self-pay | Admitting: Family Medicine

## 2024-01-23 DIAGNOSIS — Z1231 Encounter for screening mammogram for malignant neoplasm of breast: Secondary | ICD-10-CM

## 2024-02-07 ENCOUNTER — Ambulatory Visit
Admission: RE | Admit: 2024-02-07 | Discharge: 2024-02-07 | Disposition: A | Discharge status: Home / Self Care | Source: Ambulatory Visit | Attending: Family Medicine | Admitting: Family Medicine

## 2024-02-07 DIAGNOSIS — Z1231 Encounter for screening mammogram for malignant neoplasm of breast: Secondary | ICD-10-CM | POA: Insufficient documentation
